# Patient Record
Sex: Female | Born: 1965 | Race: Black or African American | Hispanic: No | Marital: Married | Smoking: Never smoker
Health system: Southern US, Community
[De-identification: ages and names within clinical notes are randomized; demographics above are authoritative.]

## PROBLEM LIST (undated history)

## (undated) DIAGNOSIS — E039 Hypothyroidism, unspecified: Secondary | ICD-10-CM

## (undated) DIAGNOSIS — C801 Malignant (primary) neoplasm, unspecified: Secondary | ICD-10-CM

## (undated) DIAGNOSIS — F319 Bipolar disorder, unspecified: Secondary | ICD-10-CM

## (undated) DIAGNOSIS — R011 Cardiac murmur, unspecified: Secondary | ICD-10-CM

## (undated) DIAGNOSIS — D649 Anemia, unspecified: Secondary | ICD-10-CM

## (undated) HISTORY — PX: OTHER SURGICAL HISTORY: SHX169

## (undated) HISTORY — PX: LAPAROSCOPIC GASTRIC SLEEVE RESECTION: SHX5895

---

## 2000-05-13 HISTORY — PX: UVULOPALATOPHARYNGOPLASTY: SHX827

## 2000-05-13 HISTORY — PX: TUBAL LIGATION: SHX77

## 2000-05-13 HISTORY — PX: TONSILLECTOMY: SUR1361

## 2002-05-13 HISTORY — PX: EYE SURGERY: SHX253

## 2009-05-04 ENCOUNTER — Emergency Department: Payer: Self-pay | Admitting: Emergency Medicine

## 2010-09-07 ENCOUNTER — Emergency Department: Payer: Self-pay | Admitting: Emergency Medicine

## 2010-12-22 IMAGING — CT CT CHEST W/ CM
1 series · 16 of 33 positions shown, 20 images · non-contrast
Comparison: none

REASON FOR EXAM: left chest pain, dyspnea elevated D dimer
COMMENTS:

PROCEDURE:     CT  - CT CHEST (FOR PE) W  - May 05, 2009  [DATE]
RESULT:
TECHNIQUE: Following administration of 100 ml of 5sovue-40D, chest CT is
obtained.

[Series 8: soft tissue · axial · 0.62mm/px · z∈[+1588,+1836]mm · 16 of 91 slices shown, 20 images]
[im 4/91  mediastinal]
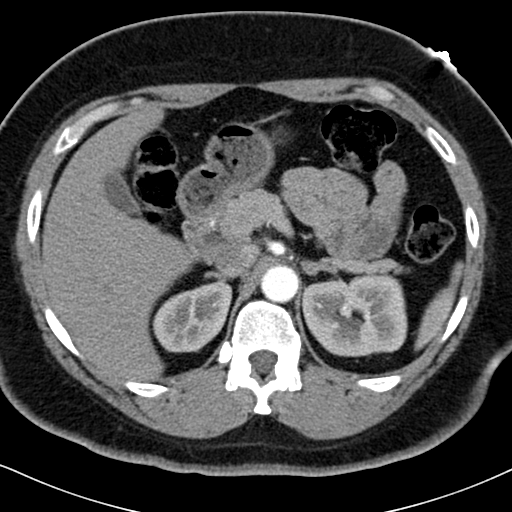
[im 4/91  lung]
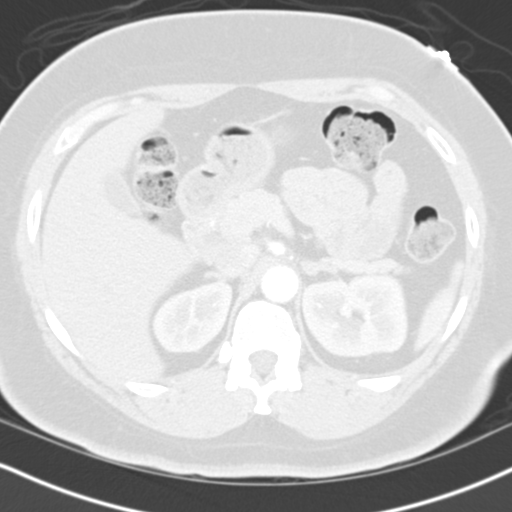
[im 11/91  lung]
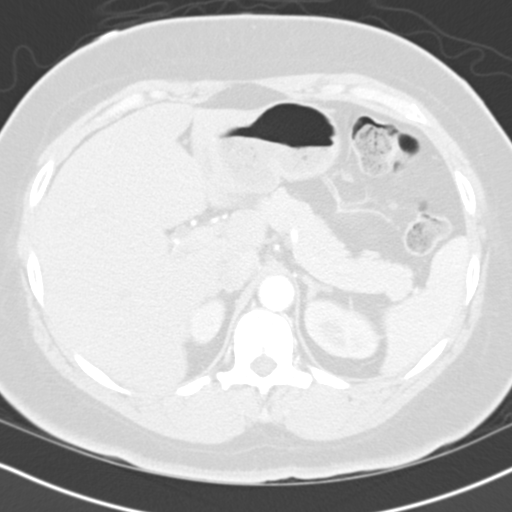
[im 17/91  lung]
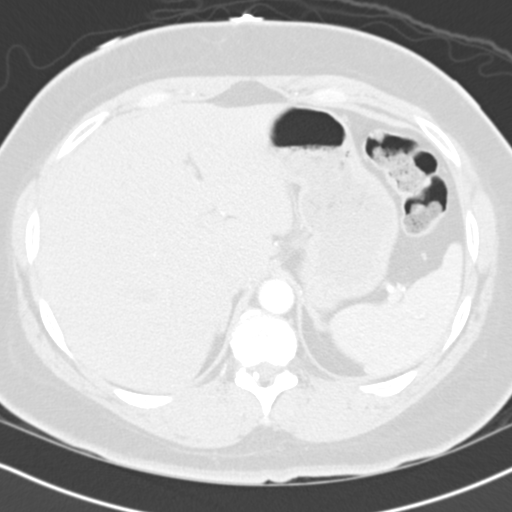
[im 21/91  lung]
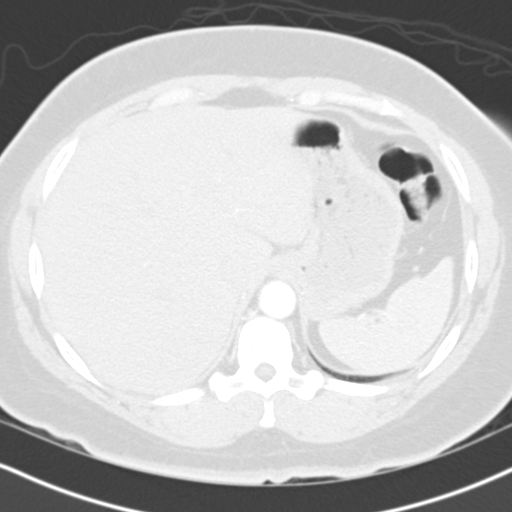
[im 27/91  mediastinal]
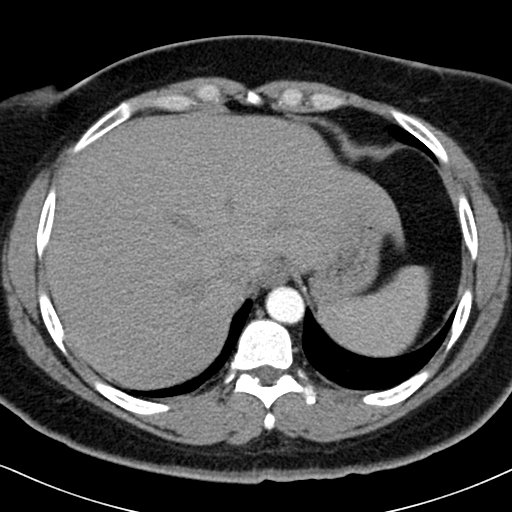
[im 27/91  lung]
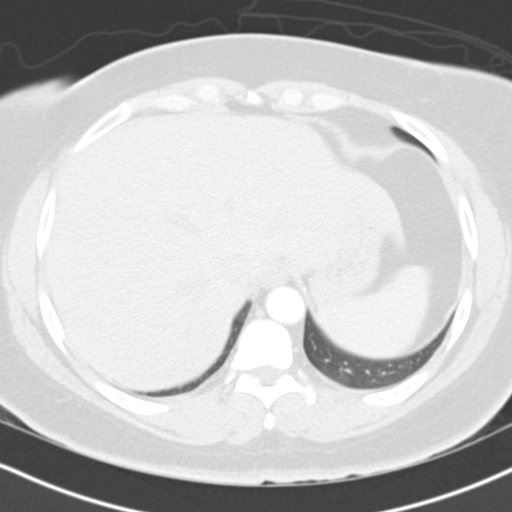
[im 34/91  lung]
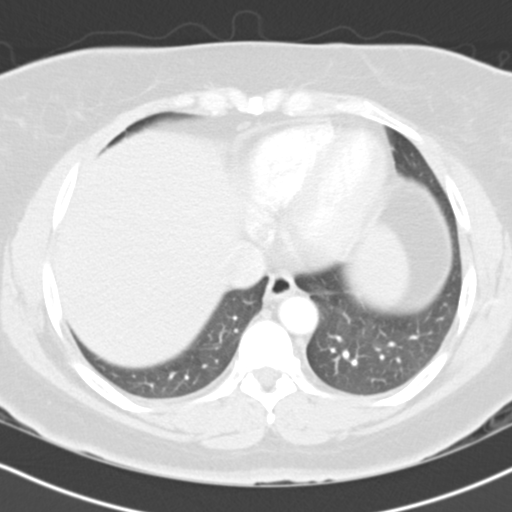
[im 41/91  lung]
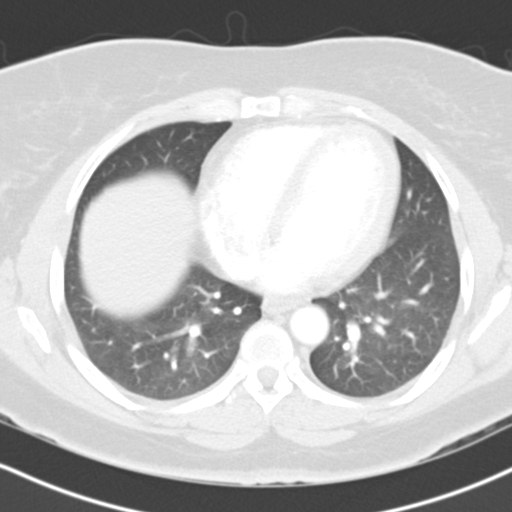
[im 44/91  lung]
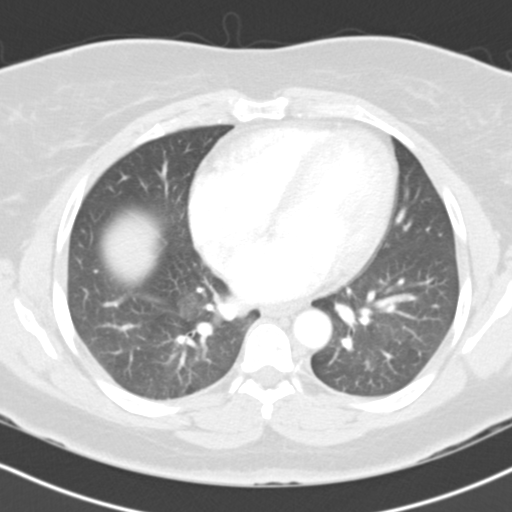
[im 49/91  mediastinal]
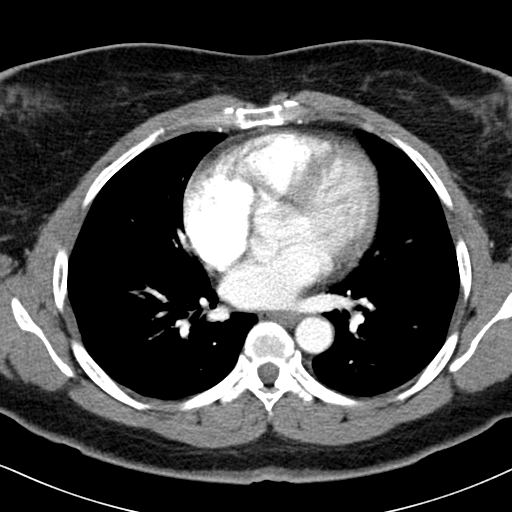
[im 49/91  lung]
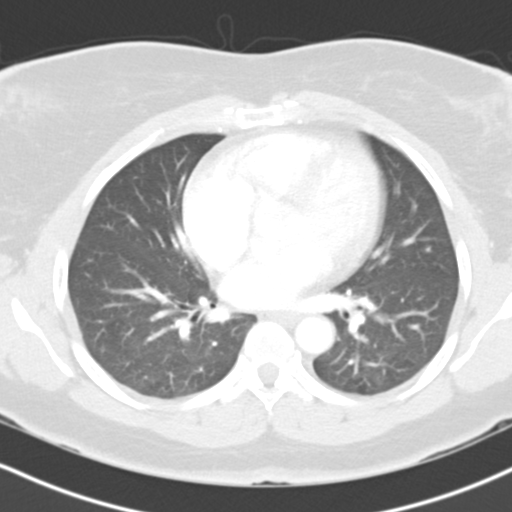
[im 54/91  lung]
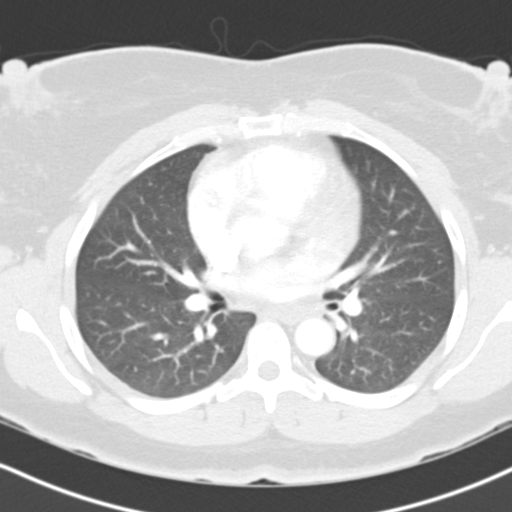
[im 57/91  lung]
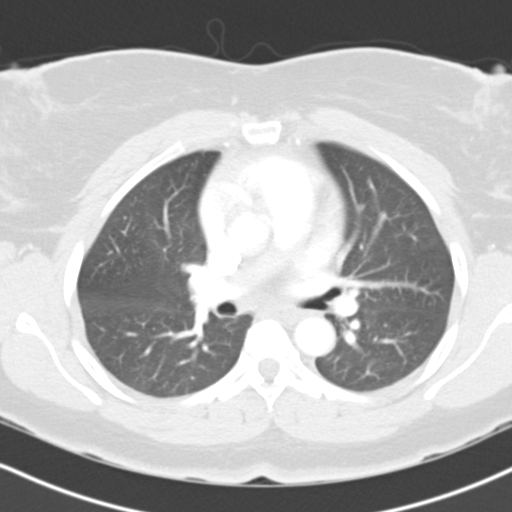
[im 64/91  lung]
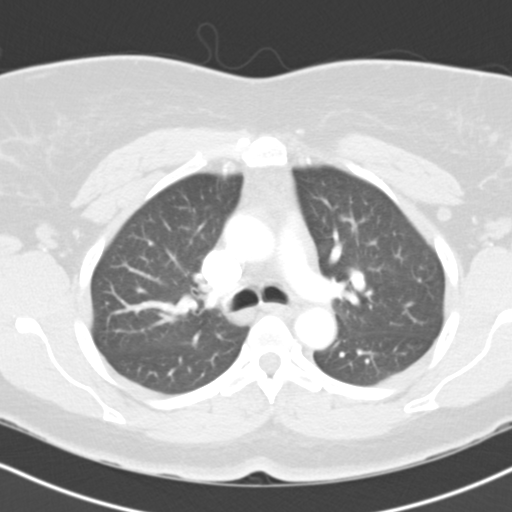
[im 71/91  mediastinal]
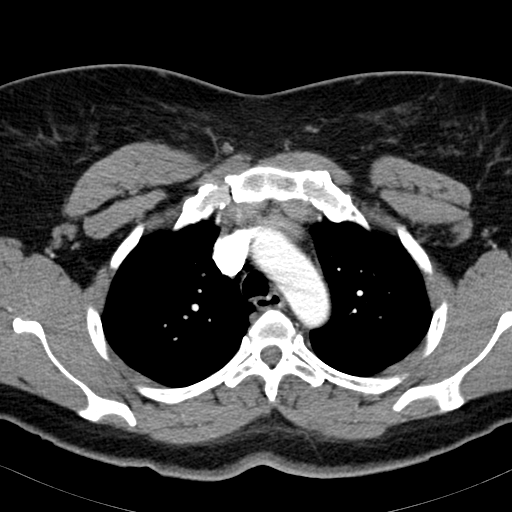
[im 71/91  lung]
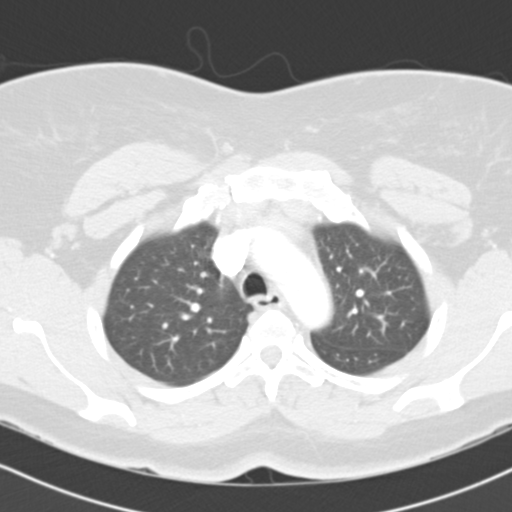
[im 74/91  lung]
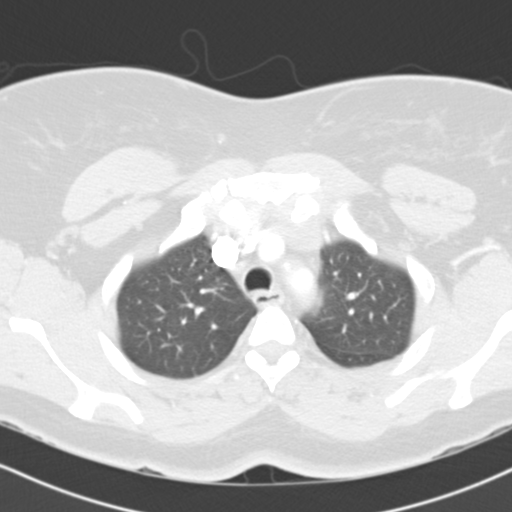
[im 81/91  lung]
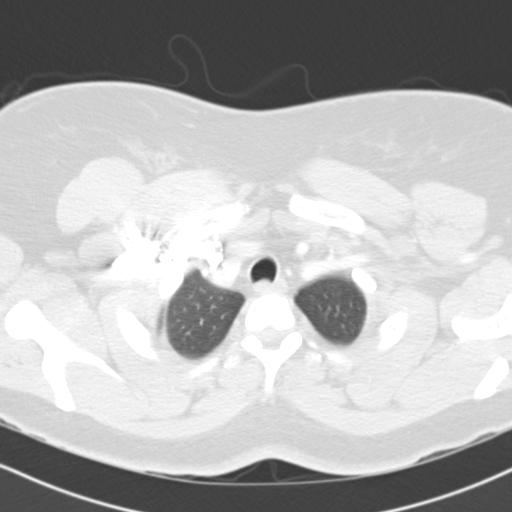
[im 87/91  lung]
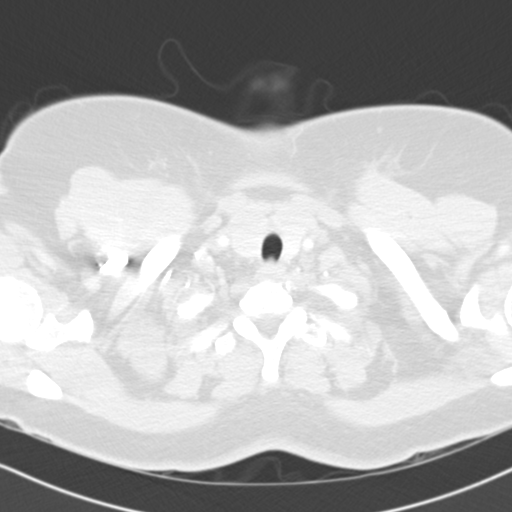

[16 of 33 positions shown; findings below may reference images not displayed]

FINDINGS: The thoracic aorta is normal. The pulmonary arteries are normal.
Axillary lymph nodes are noted. The large airways are patent. No focal
pulmonary infiltrates are noted. There is no free air.
IMPRESSION: No evidence of pulmonary embolus. The thoracic aorta is
unremarkable. No acute abnormality identified.

## 2012-10-16 ENCOUNTER — Ambulatory Visit: Payer: Self-pay | Admitting: Bariatrics

## 2012-10-16 LAB — COMPREHENSIVE METABOLIC PANEL
Alkaline Phosphatase: 77 U/L (ref 50–136)
BUN: 17 mg/dL (ref 7–18)
Bilirubin,Total: 0.2 mg/dL (ref 0.2–1.0)
Creatinine: 1.1 mg/dL (ref 0.60–1.30)
EGFR (African American): >60
Glucose: 126 mg/dL — ABNORMAL HIGH (ref 65–99)
Osmolality: 279 (ref 275–301)
Potassium: 3.1 mmol/L — ABNORMAL LOW (ref 3.5–5.1)
SGOT(AST): 11 U/L — ABNORMAL LOW (ref 15–37)

## 2012-10-16 LAB — PHOSPHORUS: Phosphorus: 4.1 mg/dL (ref 2.5–4.9)

## 2012-10-16 LAB — CBC WITH DIFFERENTIAL/PLATELET
Eosinophil %: 2.8 %
HCT: 33.7 % — ABNORMAL LOW (ref 35.0–47.0)
Lymphocyte #: 3.2 10*3/uL (ref 1.0–3.6)
Lymphocyte %: 29.7 %
MCHC: 33 g/dL (ref 32.0–36.0)
Neutrophil %: 60.6 %
Platelet: 397 10*3/uL (ref 150–440)
RBC: 4.13 10*6/uL (ref 3.80–5.20)
RDW: 15.1 % — ABNORMAL HIGH (ref 11.5–14.5)
WBC: 10.8 10*3/uL (ref 3.6–11.0)

## 2012-10-16 LAB — LIPASE, BLOOD: Lipase: 129 U/L (ref 73–393)

## 2012-10-16 LAB — MAGNESIUM: Magnesium: 1.7 mg/dL — ABNORMAL LOW

## 2012-10-16 LAB — IRON AND TIBC
Iron Bind.Cap.(Total): 292 ug/dL (ref 250–450)
Iron Saturation: 12 %
Unbound Iron-Bind.Cap.: 256 ug/dL

## 2012-10-16 LAB — PROTIME-INR
INR: 1
Prothrombin Time: 13 secs (ref 11.5–14.7)

## 2012-10-16 LAB — TSH: Thyroid Stimulating Horm: 36.2 u[IU]/mL — ABNORMAL HIGH

## 2012-10-16 LAB — HEPATIC FUNCTION PANEL A (ARMC): Bilirubin, Direct: <0.1 mg/dL (ref 0.00–0.20)

## 2012-10-16 LAB — FOLATE: Folic Acid: 26.6 ng/mL (ref 3.1–100.0)

## 2012-10-16 LAB — CALCIUM: Calcium, Total: 8.5 mg/dL (ref 8.5–10.1)

## 2012-10-16 LAB — HEMOGLOBIN A1C: Hemoglobin A1C: 6.4 % — ABNORMAL HIGH (ref 4.2–6.3)

## 2012-11-02 ENCOUNTER — Ambulatory Visit: Payer: Self-pay

## 2012-11-09 ENCOUNTER — Other Ambulatory Visit: Payer: Self-pay | Admitting: Bariatrics

## 2012-11-09 DIAGNOSIS — K219 Gastro-esophageal reflux disease without esophagitis: Secondary | ICD-10-CM

## 2012-11-10 ENCOUNTER — Ambulatory Visit: Payer: Self-pay | Admitting: Bariatrics

## 2012-11-12 ENCOUNTER — Ambulatory Visit
Admission: RE | Admit: 2012-11-12 | Discharge: 2012-11-12 | Disposition: A | Discharge status: Home / Self Care | Payer: BC Managed Care – PPO | Source: Ambulatory Visit | Attending: Bariatrics | Admitting: Bariatrics

## 2012-11-12 DIAGNOSIS — K219 Gastro-esophageal reflux disease without esophagitis: Secondary | ICD-10-CM

## 2012-11-29 ENCOUNTER — Emergency Department: Payer: Self-pay | Admitting: Emergency Medicine

## 2012-12-09 ENCOUNTER — Emergency Department: Payer: Self-pay | Admitting: Emergency Medicine

## 2012-12-09 LAB — CBC
HCT: 31.9 % — ABNORMAL LOW (ref 35.0–47.0)
HGB: 10.4 g/dL — ABNORMAL LOW (ref 12.0–16.0)
Platelet: 362 10*3/uL (ref 150–440)
WBC: 12 10*3/uL — ABNORMAL HIGH (ref 3.6–11.0)

## 2012-12-09 LAB — BASIC METABOLIC PANEL
Anion Gap: 8 (ref 7–16)
Chloride: 101 mmol/L (ref 98–107)
EGFR (African American): >60
Osmolality: 275 (ref 275–301)
Potassium: 2.8 mmol/L — ABNORMAL LOW (ref 3.5–5.1)
Sodium: 137 mmol/L (ref 136–145)

## 2012-12-09 LAB — TROPONIN I: Troponin-I: <0.02 ng/mL

## 2013-02-23 ENCOUNTER — Ambulatory Visit: Payer: Self-pay | Admitting: Bariatrics

## 2013-03-02 ENCOUNTER — Inpatient Hospital Stay: Payer: Self-pay | Admitting: Bariatrics

## 2013-03-02 LAB — CREATININE, SERUM
Creatinine: 1 mg/dL (ref 0.60–1.30)
EGFR (African American): >60

## 2013-03-02 LAB — POTASSIUM: Potassium: 3.7 mmol/L (ref 3.5–5.1)

## 2013-03-03 LAB — BASIC METABOLIC PANEL
Anion Gap: 8 (ref 7–16)
BUN: 8 mg/dL (ref 7–18)
Calcium, Total: 8.5 mg/dL (ref 8.5–10.1)
Chloride: 100 mmol/L (ref 98–107)
Creatinine: 0.9 mg/dL (ref 0.60–1.30)
EGFR (African American): >60
EGFR (Non-African Amer.): >60
Glucose: 97 mg/dL (ref 65–99)
Osmolality: 265 (ref 275–301)
Potassium: 4.1 mmol/L (ref 3.5–5.1)
Sodium: 133 mmol/L — ABNORMAL LOW (ref 136–145)

## 2013-03-03 LAB — CBC WITH DIFFERENTIAL/PLATELET
Basophil #: 0.1 10*3/uL (ref 0.0–0.1)
Basophil %: 0.4 %
HCT: 28.4 % — ABNORMAL LOW (ref 35.0–47.0)
Lymphocyte #: 1.9 10*3/uL (ref 1.0–3.6)
MCV: 78 fL — ABNORMAL LOW (ref 80–100)
Monocyte #: 1 x10 3/mm — ABNORMAL HIGH (ref 0.2–0.9)
Monocyte %: 6.2 %
Platelet: 479 10*3/uL — ABNORMAL HIGH (ref 150–440)
RDW: 15.4 % — ABNORMAL HIGH (ref 11.5–14.5)
WBC: 15.3 10*3/uL — ABNORMAL HIGH (ref 3.6–11.0)

## 2013-03-03 LAB — MAGNESIUM: Magnesium: 1.7 mg/dL — ABNORMAL LOW

## 2013-03-03 LAB — ALBUMIN: Albumin: 2.9 g/dL — ABNORMAL LOW (ref 3.4–5.0)

## 2013-03-03 LAB — PHOSPHORUS: Phosphorus: 3.2 mg/dL (ref 2.5–4.9)

## 2013-03-04 LAB — PATHOLOGY REPORT

## 2013-03-22 ENCOUNTER — Ambulatory Visit: Payer: Self-pay | Admitting: General Practice

## 2013-04-12 ENCOUNTER — Ambulatory Visit: Payer: Self-pay | Admitting: General Practice

## 2014-02-02 ENCOUNTER — Encounter (HOSPITAL_BASED_OUTPATIENT_CLINIC_OR_DEPARTMENT_OTHER): Payer: Self-pay | Admitting: *Deleted

## 2014-02-02 NOTE — Progress Notes (Addendum)
Bring all medications.Spoke with Dr. Al Corpus about Pt;s murmur and Dr.'s note - Ok for surgery,

## 2014-02-07 ENCOUNTER — Encounter (HOSPITAL_BASED_OUTPATIENT_CLINIC_OR_DEPARTMENT_OTHER): Payer: BC Managed Care – PPO | Admitting: Anesthesiology

## 2014-02-07 ENCOUNTER — Ambulatory Visit (HOSPITAL_BASED_OUTPATIENT_CLINIC_OR_DEPARTMENT_OTHER)
Admission: RE | Admit: 2014-02-07 | Discharge: 2014-02-07 | Disposition: A | Discharge status: Home / Self Care | Payer: BC Managed Care – PPO | Source: Ambulatory Visit | Attending: Specialist | Admitting: Specialist

## 2014-02-07 ENCOUNTER — Ambulatory Visit (HOSPITAL_BASED_OUTPATIENT_CLINIC_OR_DEPARTMENT_OTHER): Payer: BC Managed Care – PPO | Admitting: Anesthesiology

## 2014-02-07 ENCOUNTER — Encounter (HOSPITAL_BASED_OUTPATIENT_CLINIC_OR_DEPARTMENT_OTHER)
Admission: RE | Disposition: A | Discharge status: Home / Self Care | Payer: Self-pay | Source: Ambulatory Visit | Attending: Specialist

## 2014-02-07 ENCOUNTER — Encounter (HOSPITAL_BASED_OUTPATIENT_CLINIC_OR_DEPARTMENT_OTHER): Payer: Self-pay

## 2014-02-07 DIAGNOSIS — R011 Cardiac murmur, unspecified: Secondary | ICD-10-CM | POA: Insufficient documentation

## 2014-02-07 DIAGNOSIS — E039 Hypothyroidism, unspecified: Secondary | ICD-10-CM | POA: Insufficient documentation

## 2014-02-07 DIAGNOSIS — F319 Bipolar disorder, unspecified: Secondary | ICD-10-CM | POA: Insufficient documentation

## 2014-02-07 DIAGNOSIS — L732 Hidradenitis suppurativa: Secondary | ICD-10-CM | POA: Diagnosis present

## 2014-02-07 DIAGNOSIS — Z85850 Personal history of malignant neoplasm of thyroid: Secondary | ICD-10-CM | POA: Diagnosis not present

## 2014-02-07 HISTORY — DX: Hypothyroidism, unspecified: E03.9

## 2014-02-07 HISTORY — DX: Bipolar disorder, unspecified: F31.9

## 2014-02-07 HISTORY — PX: HYDRADENITIS EXCISION: SHX5243

## 2014-02-07 HISTORY — DX: Anemia, unspecified: D64.9

## 2014-02-07 HISTORY — DX: Cardiac murmur, unspecified: R01.1

## 2014-02-07 HISTORY — DX: Malignant (primary) neoplasm, unspecified: C80.1

## 2014-02-07 LAB — POCT HEMOGLOBIN-HEMACUE: HEMOGLOBIN: 10 g/dL — AB (ref 12.0–15.0)

## 2014-02-07 SURGERY — EXCISION, HIDRADENITIS, AXILLA
Anesthesia: General | Site: Axilla | Laterality: Left

## 2014-02-07 MED ORDER — CEFAZOLIN SODIUM-DEXTROSE 2-3 GM-% IV SOLR
2.0000 g | INTRAVENOUS | Status: AC
  Administered 2014-02-07: 2 g via INTRAVENOUS

## 2014-02-07 MED ORDER — OXYCODONE HCL 5 MG PO TABS
ORAL_TABLET | ORAL | Status: AC
  Filled 2014-02-07: qty 1

## 2014-02-07 MED ORDER — HYDROMORPHONE HCL 1 MG/ML IJ SOLN
0.2500 mg | INTRAMUSCULAR | Status: DC | PRN
  Administered 2014-02-07 (×3): 0.5 mg via INTRAVENOUS

## 2014-02-07 MED ORDER — LIDOCAINE HCL (CARDIAC) 20 MG/ML IV SOLN
INTRAVENOUS | Status: DC | PRN
  Administered 2014-02-07: 100 mg via INTRAVENOUS

## 2014-02-07 MED ORDER — SCOPOLAMINE 1 MG/3DAYS TD PT72
MEDICATED_PATCH | TRANSDERMAL | Status: DC | PRN
  Administered 2014-02-07: 1 via TRANSDERMAL

## 2014-02-07 MED ORDER — FENTANYL CITRATE 0.05 MG/ML IJ SOLN
INTRAMUSCULAR | Status: AC
Start: 2014-02-07 — End: 2014-02-07
  Filled 2014-02-07: qty 6

## 2014-02-07 MED ORDER — MIDAZOLAM HCL 2 MG/2ML IJ SOLN: 1.0000 mg | INTRAMUSCULAR | Status: DC | PRN

## 2014-02-07 MED ORDER — SCOPOLAMINE 1 MG/3DAYS TD PT72
MEDICATED_PATCH | TRANSDERMAL | Status: AC
  Filled 2014-02-07: qty 1

## 2014-02-07 MED ORDER — OXYCODONE HCL 5 MG PO TABS
5.0000 mg | ORAL_TABLET | Freq: Once | ORAL | Status: AC | PRN
  Administered 2014-02-07: 5 mg via ORAL

## 2014-02-07 MED ORDER — HYDROMORPHONE HCL 1 MG/ML IJ SOLN
INTRAMUSCULAR | Status: AC
  Filled 2014-02-07: qty 1

## 2014-02-07 MED ORDER — PROMETHAZINE HCL 25 MG/ML IJ SOLN: 6.2500 mg | INTRAMUSCULAR | Status: DC | PRN

## 2014-02-07 MED ORDER — SCOPOLAMINE 1 MG/3DAYS TD PT72: 1.0000 | MEDICATED_PATCH | TRANSDERMAL | Status: DC

## 2014-02-07 MED ORDER — MIDAZOLAM HCL 5 MG/5ML IJ SOLN
INTRAMUSCULAR | Status: DC | PRN
  Administered 2014-02-07: 2 mg via INTRAVENOUS

## 2014-02-07 MED ORDER — DIPHENHYDRAMINE HCL 50 MG/ML IJ SOLN
INTRAMUSCULAR | Status: DC | PRN
  Administered 2014-02-07: 12.5 mg via INTRAVENOUS

## 2014-02-07 MED ORDER — FENTANYL CITRATE 0.05 MG/ML IJ SOLN
INTRAMUSCULAR | Status: DC | PRN
Start: 2014-02-07 — End: 2014-02-07
  Administered 2014-02-07: 25 ug via INTRAVENOUS
  Administered 2014-02-07: 100 ug via INTRAVENOUS
  Administered 2014-02-07: 75 ug via INTRAVENOUS

## 2014-02-07 MED ORDER — DEXAMETHASONE SODIUM PHOSPHATE 4 MG/ML IJ SOLN
INTRAMUSCULAR | Status: DC | PRN
  Administered 2014-02-07: 10 mg via INTRAVENOUS

## 2014-02-07 MED ORDER — LIDOCAINE-EPINEPHRINE 0.5 %-1:200000 IJ SOLN
INTRAMUSCULAR | Status: AC
  Filled 2014-02-07: qty 2

## 2014-02-07 MED ORDER — LACTATED RINGERS IV SOLN
INTRAVENOUS | Status: DC
  Administered 2014-02-07: 09:00:00 via INTRAVENOUS

## 2014-02-07 MED ORDER — PROPOFOL 10 MG/ML IV BOLUS
INTRAVENOUS | Status: DC | PRN
  Administered 2014-02-07: 200 mg via INTRAVENOUS

## 2014-02-07 MED ORDER — FENTANYL CITRATE 0.05 MG/ML IJ SOLN: 50.0000 ug | INTRAMUSCULAR | Status: DC | PRN

## 2014-02-07 MED ORDER — LIDOCAINE-EPINEPHRINE 0.5 %-1:200000 IJ SOLN
INTRAMUSCULAR | Status: DC | PRN
  Administered 2014-02-07: 09:00:00 via INTRAMUSCULAR

## 2014-02-07 MED ORDER — MIDAZOLAM HCL 2 MG/2ML IJ SOLN
INTRAMUSCULAR | Status: AC
Start: 2014-02-07 — End: 2014-02-07
  Filled 2014-02-07: qty 2

## 2014-02-07 MED ORDER — CEFAZOLIN SODIUM-DEXTROSE 2-3 GM-% IV SOLR
INTRAVENOUS | Status: AC
Start: 2014-02-07 — End: 2014-02-07
  Filled 2014-02-07: qty 50

## 2014-02-07 MED ORDER — ONDANSETRON HCL 4 MG/2ML IJ SOLN
INTRAMUSCULAR | Status: DC | PRN
  Administered 2014-02-07: 4 mg via INTRAVENOUS

## 2014-02-07 MED ORDER — OXYCODONE HCL 5 MG/5ML PO SOLN: 5.0000 mg | Freq: Once | ORAL | Status: AC | PRN

## 2014-02-07 SURGICAL SUPPLY — 61 items
BAG DECANTER FOR FLEXI CONT (MISCELLANEOUS) IMPLANT
BENZOIN TINCTURE PRP APPL 2/3 (GAUZE/BANDAGES/DRESSINGS) IMPLANT
BLADE KNIFE PERSONA 10 (BLADE) ×3 IMPLANT
BLADE KNIFE PERSONA 15 (BLADE) ×3 IMPLANT
BNDG COHESIVE 4X5 TAN STRL (GAUZE/BANDAGES/DRESSINGS) IMPLANT
BRIEF STRETCH FOR OB PAD LRG (UNDERPADS AND DIAPERS) IMPLANT
CANISTER SUCT 1200ML W/VALVE (MISCELLANEOUS) ×3 IMPLANT
COVER MAYO STAND STRL (DRAPES) ×3 IMPLANT
COVER TABLE BACK 60X90 (DRAPES) ×3 IMPLANT
DECANTER SPIKE VIAL GLASS SM (MISCELLANEOUS) IMPLANT
DRAIN CHANNEL 10M FLAT 3/4 FLT (DRAIN) IMPLANT
DRAIN CHANNEL 7F FF FLAT (WOUND CARE) IMPLANT
DRAPE U-SHAPE 76X120 STRL (DRAPES) ×3 IMPLANT
DRSG OPSITE 6X11 MED (GAUZE/BANDAGES/DRESSINGS) ×3 IMPLANT
DRSG PAD ABDOMINAL 8X10 ST (GAUZE/BANDAGES/DRESSINGS) ×6 IMPLANT
DRSG TELFA 3X8 NADH (GAUZE/BANDAGES/DRESSINGS) ×3 IMPLANT
ELECT REM PT RETURN 9FT ADLT (ELECTROSURGICAL) ×3
ELECTRODE REM PT RTRN 9FT ADLT (ELECTROSURGICAL) ×1 IMPLANT
EVACUATOR SILICONE 100CC (DRAIN) IMPLANT
FILTER 7/8 IN (FILTER) ×3 IMPLANT
GAUZE SPONGE 4X4 12PLY STRL (GAUZE/BANDAGES/DRESSINGS) ×3 IMPLANT
GAUZE XEROFORM 5X9 LF (GAUZE/BANDAGES/DRESSINGS) ×3 IMPLANT
GLOVE BIO SURGEON STRL SZ 6.5 (GLOVE) ×2 IMPLANT
GLOVE BIO SURGEONS STRL SZ 6.5 (GLOVE) ×1
GLOVE BIOGEL M STRL SZ7.5 (GLOVE) ×3 IMPLANT
GLOVE BIOGEL PI IND STRL 8 (GLOVE) ×1 IMPLANT
GLOVE BIOGEL PI INDICATOR 8 (GLOVE) ×2
GLOVE ECLIPSE 7.0 STRL STRAW (GLOVE) ×3 IMPLANT
GOWN STRL REUS W/ TWL XL LVL3 (GOWN DISPOSABLE) ×2 IMPLANT
GOWN STRL REUS W/TWL XL LVL3 (GOWN DISPOSABLE) ×4
IV NS 500ML (IV SOLUTION) ×2
IV NS 500ML BAXH (IV SOLUTION) ×1 IMPLANT
NDL SAFETY ECLIPSE 18X1.5 (NEEDLE) IMPLANT
NEEDLE HYPO 18GX1.5 SHARP (NEEDLE)
NEEDLE HYPO 25X1 1.5 SAFETY (NEEDLE) IMPLANT
NEEDLE SPNL 18GX3.5 QUINCKE PK (NEEDLE) ×3 IMPLANT
NS IRRIG 1000ML POUR BTL (IV SOLUTION) ×3 IMPLANT
PACK BASIN DAY SURGERY FS (CUSTOM PROCEDURE TRAY) ×3 IMPLANT
PEN SKIN MARKING BROAD TIP (MISCELLANEOUS) ×3 IMPLANT
PIN SAFETY STERILE (MISCELLANEOUS) IMPLANT
SHEET MEDIUM DRAPE 40X70 STRL (DRAPES) IMPLANT
SHEETING SILICONE GEL EPI DERM (MISCELLANEOUS) ×3 IMPLANT
SLEEVE SCD COMPRESS KNEE MED (MISCELLANEOUS) ×3 IMPLANT
SPONGE LAP 18X18 X RAY DECT (DISPOSABLE) ×6 IMPLANT
STAPLER VISISTAT 35W (STAPLE) IMPLANT
STOCKINETTE IMPERVIOUS LG (DRAPES) IMPLANT
STRIP SUTURE WOUND CLOSURE 1/2 (SUTURE) ×6 IMPLANT
SUT ETHILON 3 0 FSL (SUTURE) ×3 IMPLANT
SUT MNCRL AB 3-0 PS2 18 (SUTURE) ×3 IMPLANT
SUT MON AB 2-0 CT1 36 (SUTURE) ×6 IMPLANT
SUT PROLENE 4 0 P 3 18 (SUTURE) IMPLANT
SYR 20CC LL (SYRINGE) IMPLANT
SYR 50ML LL SCALE MARK (SYRINGE) ×6 IMPLANT
SYR CONTROL 10ML LL (SYRINGE) ×3 IMPLANT
TOWEL OR 17X24 6PK STRL BLUE (TOWEL DISPOSABLE) ×9 IMPLANT
TRAY DSU PREP LF (CUSTOM PROCEDURE TRAY) ×3 IMPLANT
TUBE CONNECTING 20'X1/4 (TUBING) ×1
TUBE CONNECTING 20X1/4 (TUBING) ×2 IMPLANT
UNDERPAD 30X30 INCONTINENT (UNDERPADS AND DIAPERS) ×3 IMPLANT
VAC PENCILS W/TUBING CLEAR (MISCELLANEOUS) ×3 IMPLANT
YANKAUER SUCT BULB TIP NO VENT (SUCTIONS) ×3 IMPLANT

## 2014-02-07 NOTE — Discharge Instructions (Signed)
Activity (include date of return to work if known) °As tolerated: NO showers °NO driving °No heavy activities ° °Diet:regular No restrictions: ° °Wound Care: Keep dressing clean & dry ° °Do not change dressings ° °Special Instructions: °Do not raise arms up °Call Doctor if any unusual problems occur such as pain, excessive °Bleeding, unrelieved Nausea/vomiting, Fever &/or chills °When lying down, keep head elevated on 2-3 pillows or back-rest ° °Follow-up appointment: Please call the office. ° °The patient received discharge instruction from:___________________________________________ ° ° ° °Patient signature ________________________________________ / Date___________ ° ° ° °Signature of individual providing instructions/ Date________________            ° ° °Post Anesthesia Home Care Instructions ° °Activity: °Get plenty of rest for the remainder of the day. A responsible adult should stay with you for 24 hours following the procedure.  °For the next 24 hours, DO NOT: °-Drive a car °-Operate machinery °-Drink alcoholic beverages °-Take any medication unless instructed by your physician °-Make any legal decisions or sign important papers. ° °Meals: °Start with liquid foods such as gelatin or soup. Progress to regular foods as tolerated. Avoid greasy, spicy, heavy foods. If nausea and/or vomiting occur, drink only clear liquids until the nausea and/or vomiting subsides. Call your physician if vomiting continues. ° °Special Instructions/Symptoms: °Your throat may feel dry or sore from the anesthesia or the breathing tube placed in your throat during surgery. If this causes discomfort, gargle with warm salt water. The discomfort should disappear within 24 hours. ° °

## 2014-02-07 NOTE — Anesthesia Preprocedure Evaluation (Signed)
Anesthesia Evaluation  Patient identified by MRN, date of birth, ID band Patient awake    Reviewed: Allergy & Precautions, H&P , NPO status , Patient's Chart, lab work & pertinent test results  Airway Mallampati: II TM Distance: >3 FB Neck ROM: Full    Dental  (+) Teeth Intact, Dental Advisory Given   Pulmonary neg pulmonary ROS,    Pulmonary exam normal       Cardiovascular negative cardio ROS      Neuro/Psych PSYCHIATRIC DISORDERS Bipolar Disorder negative neurological ROS     GI/Hepatic negative GI ROS, Neg liver ROS,   Endo/Other  Hypothyroidism   Renal/GU negative Renal ROS  negative genitourinary   Musculoskeletal negative musculoskeletal ROS (+)   Abdominal   Peds negative pediatric ROS (+)  Hematology negative hematology ROS (+)   Anesthesia Other Findings   Reproductive/Obstetrics negative OB ROS                           Anesthesia Physical Anesthesia Plan  ASA: II  Anesthesia Plan: General   Post-op Pain Management:    Induction: Intravenous  Airway Management Planned: LMA  Additional Equipment:   Intra-op Plan:   Post-operative Plan: Extubation in OR  Informed Consent: I have reviewed the patients History and Physical, chart, labs and discussed the procedure including the risks, benefits and alternatives for the proposed anesthesia with the patient or authorized representative who has indicated his/her understanding and acceptance.   Dental advisory given  Plan Discussed with: CRNA, Anesthesiologist and Surgeon  Anesthesia Plan Comments:         Anesthesia Quick Evaluation

## 2014-02-07 NOTE — Anesthesia Procedure Notes (Signed)
Procedure Name: LMA Insertion Performed by: Azani Brogdon W Pre-anesthesia Checklist: Patient identified, Timeout performed, Emergency Drugs available, Suction available and Patient being monitored Patient Re-evaluated:Patient Re-evaluated prior to inductionOxygen Delivery Method: Circle system utilized Preoxygenation: Pre-oxygenation with 100% oxygen Intubation Type: IV induction Ventilation: Mask ventilation without difficulty LMA: LMA inserted LMA Size: 4.0 Tube type: Oral Number of attempts: 1 Placement Confirmation: positive ETCO2 Tube secured with: Tape Dental Injury: Teeth and Oropharynx as per pre-operative assessment      

## 2014-02-07 NOTE — Anesthesia Postprocedure Evaluation (Signed)
  Anesthesia Post-op Note  Patient: Sharon Everett  Procedure(s) Performed: Procedure(s): EXCISION HIDRADENITIS left  AXILLA WITH RYAN Coleman  (Left)  Patient Location: PACU  Anesthesia Type: No value filed.   Level of Consciousness: awake, alert  and oriented  Airway and Oxygen Therapy: Patient Spontanous Breathing  Post-op Pain: mild  Post-op Assessment: Post-op Vital signs reviewed  Post-op Vital Signs: Reviewed  Last Vitals:  Filed Vitals:   02/07/14 1144  BP: 147/82  Pulse: 78  Temp: 36.8 C  Resp: 16    Complications: No apparent anesthesia complications

## 2014-02-07 NOTE — Brief Op Note (Signed)
02/07/2014  10:10 AM  PATIENT:  Sharon Everett  48 y.o. female  PRE-OPERATIVE DIAGNOSIS:  Hidradenitis [705.83]Left Axilla  POST-OPERATIVE DIAGNOSIS:  Hidradenitis [705.83]Left Axilla  PROCEDURE:  Procedure(s): EXCISION HIDRADENITIS left  AXILLA WITH RYAN POLLOCK CLOSURE  (Left)  SURGEON:  Surgeon(s) and Role:    * Cristine Polio, MD - Primary  PHYSICIAN ASSISTANT:   ASSISTANTS: none   ANESTHESIA:   general  EBL:  Total I/O In: 1300 [I.V.:1300] Out: -   BLOOD ADMINISTERED:none  DRAINS: none   LOCAL MEDICATIONS USED:  LIDOCAINE   SPECIMEN:  Excision  DISPOSITION OF SPECIMEN:  PATHOLOGY  COUNTS:  YES  TOURNIQUET:  * No tourniquets in log *  DICTATION: .Other Dictation: Dictation Number [id number]  PLAN OF CARE: Discharge to home after PACU  PATIENT DISPOSITION:  PACU - hemodynamically stable.   Delay start of Pharmacological VTE agent (>24hrs) due to surgical blood loss or risk of bleeding: yes

## 2014-02-07 NOTE — H&P (Signed)
Sharon Everett is an 48 y.o. female.   Chief Complaint: Hidraidnitis right and left axillae HPI: Increased drainage and very painful breakouts  Hx of several breakouts requiring  Incision and drainage  Past Medical History  Diagnosis Date  . Heart murmur   . Hypothyroidism   . Bipolar disorder   . Cancer     thyroid cancer - 2010  . Anemia     Past Surgical History  Procedure Laterality Date  . Thyroidecttomy      2011  . Laparoscopic gastric sleeve resection      2014 at [place]  . Eye surgery  2004    Lasix  . Tubal ligation  2002    History reviewed. No pertinent family history. Social History:  reports that she has never smoked. She does not have any smokeless tobacco history on file. She reports that she drinks alcohol. She reports that she does not use illicit drugs.  Allergies: No Known Allergies  No prescriptions prior to admission    No results found for this or any previous visit (from the past 48 hour(s)). No results found.  Review of Systems  Constitutional: Negative.   HENT: Negative.   Eyes: Negative.   Respiratory: Negative.   Cardiovascular: Negative.   Gastrointestinal: Negative.   Genitourinary: Negative.   Musculoskeletal: Negative.   Skin: Positive for rash.  Neurological: Negative.   Endo/Heme/Allergies: Negative.   Psychiatric/Behavioral: Negative.     Height 5\' 9"  (1.753 m), weight 87.091 kg (192 lb), last menstrual period 01/17/2014. Physical Exam   Assessment/Plan Hidraidnitis suppurativa axilla  For excision and plastic rotational flap coverage  [name] closure  Sharon Everett 02/07/2014, 8:11 AM

## 2014-02-07 NOTE — Transfer of Care (Signed)
Immediate Anesthesia Transfer of Care Note  Patient: Sharon Everett  Procedure(s) Performed: Procedure(s): EXCISION HIDRADENITIS left  AXILLA WITH RYAN Ringling  (Left)  Patient Location: PACU  Anesthesia Type:General  Level of Consciousness: awake and oriented  Airway & Oxygen Therapy: Patient Spontanous Breathing and Patient connected to face mask oxygen  Post-op Assessment: Report given to PACU RN and Post -op Vital signs reviewed and stable  Post vital signs: Reviewed and stable  Complications: No apparent anesthesia complications

## 2014-02-08 ENCOUNTER — Encounter (HOSPITAL_BASED_OUTPATIENT_CLINIC_OR_DEPARTMENT_OTHER): Payer: Self-pay | Admitting: Specialist

## 2014-02-08 NOTE — Op Note (Signed)
Sharon Everett, Sharon Everett               ACCOUNT NO.:  [id number]  MEDICAL RECORD NO.:  [id number]  LOCATION:                                 FACILITY:  PHYSICIAN:  Berneta Sages L. 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  These are very severe in nature. She has had multiple I and D's done with no avail since that is not the treatment for this condition.  PROCEDURES PLANNED:  Wide excision of large deficits of the left axilla which measured over 8.5 inches x 7 inches with rotational flap coverage, Ryan-Pollack closure.  All procedure in detail as well as tentative risks and possible complications were explained to the patient preoperatively, the patient understands and consents to surgery.  She understands also the risk of scarring, infection, possible dehiscence, etc.  DESCRIPTION OF PROCEDURE:  The patient underwent drawings of the areas of the left axilla with a marking pen, outlined a large, large flap, elliptical in nature and slightly diamond shaped.  She then underwent general anesthesia, intubated orally.  Prep was done to the left arm, upper extremity, breast, chest, neck areas with Hibiclens soap and solution and walled off with sterile towels and drapes so as to make a sterile field.  A 0.5% Xylocaine with epinephrine was injected locally, a total of 200 mL 1:400,000 concentration.  After waiting appropriate amount of time, we placed OpSite over the area to prevent cross contamination during the repair.  Wide excision was made of the previously drawn elliptical area with a Bovie unit on cutting down to underlying superficial fascia.  I was able to then dissect out all of the diseased tissue  deep down to some of the deeper fascia and certain areas in the middle.  We were able to remove the mass in toto and sent it to pathology for examination.  Next, proper hemostasis was maintained with the Bovie on coagulation.  The medial and lateral flaps then were freed out approximately 8 cm.  After proper hemostasis, the flaps were then rotated and advanced together and stayed with deep sutures of 2-0 Monocryl x2 layers, subcutaneous stitch of 2-0 Monocryl, subdermal suture of 3-0 Monocryl, and then a running subcuticular stitch of 3-0 Monocryl.  Next, stay sutures of 3-0 nylon were placed intermittently for security.  The wounds were cleansed.  Steri-Strips and sterile silicone gel patches were placed.  Xeroform, 4x4s, ABDs, and Hypafix tape.  She tolerated the procedures very, very well and was taken to recovery in excellent condition.  ESTIMATED BLOOD LOSS:  Less than 50 mL.  COMPLICATIONS:  None.     Odella Aquas. Towanda Malkin, M.D.    Elie Confer  D:  02/07/2014  T:  02/08/2014  Job:  [id number]

## 2014-04-14 ENCOUNTER — Encounter (HOSPITAL_BASED_OUTPATIENT_CLINIC_OR_DEPARTMENT_OTHER): Payer: Self-pay | Admitting: *Deleted

## 2014-04-14 NOTE — Progress Notes (Signed)
Pt was here 9/15-did well-no labs neded

## 2014-04-18 ENCOUNTER — Encounter (HOSPITAL_BASED_OUTPATIENT_CLINIC_OR_DEPARTMENT_OTHER)
Admission: RE | Disposition: A | Discharge status: Home / Self Care | Payer: Self-pay | Source: Ambulatory Visit | Attending: Specialist

## 2014-04-18 ENCOUNTER — Encounter (HOSPITAL_BASED_OUTPATIENT_CLINIC_OR_DEPARTMENT_OTHER): Payer: Self-pay | Admitting: *Deleted

## 2014-04-18 ENCOUNTER — Ambulatory Visit (HOSPITAL_BASED_OUTPATIENT_CLINIC_OR_DEPARTMENT_OTHER): Payer: BC Managed Care – PPO | Admitting: Anesthesiology

## 2014-04-18 ENCOUNTER — Ambulatory Visit (HOSPITAL_BASED_OUTPATIENT_CLINIC_OR_DEPARTMENT_OTHER)
Admission: RE | Admit: 2014-04-18 | Discharge: 2014-04-18 | Disposition: A | Discharge status: Home / Self Care | Payer: BC Managed Care – PPO | Source: Ambulatory Visit | Attending: Specialist | Admitting: Specialist

## 2014-04-18 DIAGNOSIS — E039 Hypothyroidism, unspecified: Secondary | ICD-10-CM | POA: Diagnosis not present

## 2014-04-18 DIAGNOSIS — L732 Hidradenitis suppurativa: Secondary | ICD-10-CM | POA: Diagnosis not present

## 2014-04-18 DIAGNOSIS — Z85850 Personal history of malignant neoplasm of thyroid: Secondary | ICD-10-CM | POA: Insufficient documentation

## 2014-04-18 HISTORY — PX: HYDRADENITIS EXCISION: SHX5243

## 2014-04-18 LAB — POCT HEMOGLOBIN-HEMACUE: Hemoglobin: 9.2 g/dL — ABNORMAL LOW (ref 12.0–15.0)

## 2014-04-18 SURGERY — EXCISION, HIDRADENITIS, AXILLA
Anesthesia: General | Site: Axilla | Laterality: Right

## 2014-04-18 MED ORDER — FENTANYL CITRATE 0.05 MG/ML IJ SOLN: 25.0000 ug | Freq: Once | INTRAMUSCULAR | Status: DC

## 2014-04-18 MED ORDER — DEXAMETHASONE SODIUM PHOSPHATE 4 MG/ML IJ SOLN
INTRAMUSCULAR | Status: DC | PRN
  Administered 2014-04-18: 10 mg via INTRAVENOUS

## 2014-04-18 MED ORDER — FENTANYL CITRATE 0.05 MG/ML IJ SOLN
25.0000 ug | INTRAMUSCULAR | Status: DC | PRN
  Administered 2014-04-18: 25 ug via INTRAVENOUS
  Administered 2014-04-18 (×3): 50 ug via INTRAVENOUS

## 2014-04-18 MED ORDER — FENTANYL CITRATE 0.05 MG/ML IJ SOLN
INTRAMUSCULAR | Status: AC
Start: 2014-04-18 — End: 2014-04-18
  Filled 2014-04-18: qty 2

## 2014-04-18 MED ORDER — LIDOCAINE HCL (CARDIAC) 20 MG/ML IV SOLN
INTRAVENOUS | Status: DC | PRN
  Administered 2014-04-18: 50 mg via INTRAVENOUS

## 2014-04-18 MED ORDER — OXYCODONE HCL 5 MG PO TABS
ORAL_TABLET | ORAL | Status: AC
  Filled 2014-04-18: qty 1

## 2014-04-18 MED ORDER — FENTANYL CITRATE 0.05 MG/ML IJ SOLN
INTRAMUSCULAR | Status: AC
  Filled 2014-04-18: qty 2

## 2014-04-18 MED ORDER — CEFAZOLIN SODIUM-DEXTROSE 2-3 GM-% IV SOLR
2.0000 g | INTRAVENOUS | Status: AC
  Administered 2014-04-18: 2 g via INTRAVENOUS

## 2014-04-18 MED ORDER — FENTANYL CITRATE 0.05 MG/ML IJ SOLN
INTRAMUSCULAR | Status: AC
  Filled 2014-04-18: qty 4

## 2014-04-18 MED ORDER — MIDAZOLAM HCL 2 MG/2ML IJ SOLN
INTRAMUSCULAR | Status: AC
  Filled 2014-04-18: qty 2

## 2014-04-18 MED ORDER — MIDAZOLAM HCL 5 MG/5ML IJ SOLN
INTRAMUSCULAR | Status: DC | PRN
  Administered 2014-04-18: 2 mg via INTRAVENOUS

## 2014-04-18 MED ORDER — CEFAZOLIN SODIUM-DEXTROSE 2-3 GM-% IV SOLR
INTRAVENOUS | Status: AC
Start: 2014-04-18 — End: 2014-04-18
  Filled 2014-04-18: qty 50

## 2014-04-18 MED ORDER — MIDAZOLAM HCL 2 MG/2ML IJ SOLN: 1.0000 mg | INTRAMUSCULAR | Status: DC | PRN

## 2014-04-18 MED ORDER — ONDANSETRON HCL 4 MG/2ML IJ SOLN
INTRAMUSCULAR | Status: DC | PRN
Start: 2014-04-18 — End: 2014-04-18
  Administered 2014-04-18: 4 mg via INTRAVENOUS

## 2014-04-18 MED ORDER — OXYCODONE HCL 5 MG PO TABS
5.0000 mg | ORAL_TABLET | Freq: Once | ORAL | Status: AC
  Administered 2014-04-18: 5 mg via ORAL

## 2014-04-18 MED ORDER — OXYCODONE HCL 5 MG PO TABS
5.0000 mg | ORAL_TABLET | ORAL | Status: DC | PRN
  Administered 2014-04-18: 5 mg via ORAL

## 2014-04-18 MED ORDER — LACTATED RINGERS IV SOLN
INTRAVENOUS | Status: DC
  Administered 2014-04-18: 07:00:00 via INTRAVENOUS

## 2014-04-18 MED ORDER — FENTANYL CITRATE 0.05 MG/ML IJ SOLN
INTRAMUSCULAR | Status: DC | PRN
  Administered 2014-04-18: 100 ug via INTRAVENOUS

## 2014-04-18 MED ORDER — SODIUM CHLORIDE 0.9 % IV SOLN
INTRAVENOUS | Status: DC | PRN
  Administered 2014-04-18: 200 mL via INTRAMUSCULAR

## 2014-04-18 MED ORDER — PROPOFOL 10 MG/ML IV BOLUS
INTRAVENOUS | Status: DC | PRN
  Administered 2014-04-18: 200 mg via INTRAVENOUS

## 2014-04-18 MED ORDER — FENTANYL CITRATE 0.05 MG/ML IJ SOLN: 100.0000 ug | Freq: Once | INTRAMUSCULAR | Status: DC

## 2014-04-18 MED ORDER — FENTANYL CITRATE 0.05 MG/ML IJ SOLN: 50.0000 ug | INTRAMUSCULAR | Status: DC | PRN

## 2014-04-18 SURGICAL SUPPLY — 59 items
BAG DECANTER FOR FLEXI CONT (MISCELLANEOUS) ×3 IMPLANT
BENZOIN TINCTURE PRP APPL 2/3 (GAUZE/BANDAGES/DRESSINGS) ×3 IMPLANT
BLADE KNIFE PERSONA 10 (BLADE) ×3 IMPLANT
BLADE KNIFE PERSONA 15 (BLADE) ×3 IMPLANT
BNDG COHESIVE 4X5 TAN STRL (GAUZE/BANDAGES/DRESSINGS) ×6 IMPLANT
BRIEF STRETCH FOR OB PAD LRG (UNDERPADS AND DIAPERS) IMPLANT
CANISTER SUCT 1200ML W/VALVE (MISCELLANEOUS) ×3 IMPLANT
COVER BACK TABLE 60X90IN (DRAPES) ×3 IMPLANT
COVER MAYO STAND STRL (DRAPES) ×3 IMPLANT
DECANTER SPIKE VIAL GLASS SM (MISCELLANEOUS) IMPLANT
DRAIN CHANNEL 10M FLAT 3/4 FLT (DRAIN) IMPLANT
DRAIN CHANNEL 7F FF FLAT (WOUND CARE) IMPLANT
DRAPE U-SHAPE 76X120 STRL (DRAPES) ×6 IMPLANT
DRSG PAD ABDOMINAL 8X10 ST (GAUZE/BANDAGES/DRESSINGS) ×3 IMPLANT
DRSG TELFA 3X8 NADH (GAUZE/BANDAGES/DRESSINGS) ×3 IMPLANT
ELECT REM PT RETURN 9FT ADLT (ELECTROSURGICAL) ×3
ELECTRODE REM PT RTRN 9FT ADLT (ELECTROSURGICAL) ×1 IMPLANT
EVACUATOR SILICONE 100CC (DRAIN) IMPLANT
FILTER 7/8 IN (FILTER) ×3 IMPLANT
GAUZE SPONGE 4X4 12PLY STRL (GAUZE/BANDAGES/DRESSINGS) ×3 IMPLANT
GAUZE XEROFORM 5X9 LF (GAUZE/BANDAGES/DRESSINGS) ×3 IMPLANT
GLOVE BIO SURGEON STRL SZ 6.5 (GLOVE) ×2 IMPLANT
GLOVE BIO SURGEONS STRL SZ 6.5 (GLOVE) ×1
GLOVE BIOGEL M STRL SZ7.5 (GLOVE) ×3 IMPLANT
GLOVE BIOGEL PI IND STRL 8 (GLOVE) ×1 IMPLANT
GLOVE BIOGEL PI INDICATOR 8 (GLOVE) ×2
GLOVE ECLIPSE 7.0 STRL STRAW (GLOVE) ×3 IMPLANT
GOWN STRL REUS W/ TWL XL LVL3 (GOWN DISPOSABLE) ×2 IMPLANT
GOWN STRL REUS W/TWL XL LVL3 (GOWN DISPOSABLE) ×4
IV NS 500ML (IV SOLUTION) ×2
IV NS 500ML BAXH (IV SOLUTION) ×1 IMPLANT
NDL SAFETY ECLIPSE 18X1.5 (NEEDLE) IMPLANT
NEEDLE HYPO 18GX1.5 SHARP (NEEDLE)
NEEDLE HYPO 25X1 1.5 SAFETY (NEEDLE) IMPLANT
NEEDLE SPNL 18GX3.5 QUINCKE PK (NEEDLE) ×3 IMPLANT
NS IRRIG 1000ML POUR BTL (IV SOLUTION) ×3 IMPLANT
PACK BASIN DAY SURGERY FS (CUSTOM PROCEDURE TRAY) ×3 IMPLANT
PEN SKIN MARKING BROAD TIP (MISCELLANEOUS) ×3 IMPLANT
PIN SAFETY STERILE (MISCELLANEOUS) IMPLANT
SHEET MEDIUM DRAPE 40X70 STRL (DRAPES) ×3 IMPLANT
SLEEVE SCD COMPRESS KNEE MED (MISCELLANEOUS) ×3 IMPLANT
SPONGE LAP 18X18 X RAY DECT (DISPOSABLE) ×6 IMPLANT
STAPLER VISISTAT 35W (STAPLE) IMPLANT
STOCKINETTE IMPERVIOUS LG (DRAPES) ×3 IMPLANT
STRIP SUTURE WOUND CLOSURE 1/2 (SUTURE) ×3 IMPLANT
SUT ETHILON 3 0 FSL (SUTURE) ×3 IMPLANT
SUT MNCRL AB 3-0 PS2 18 (SUTURE) ×3 IMPLANT
SUT MON AB 2-0 CT1 36 (SUTURE) ×3 IMPLANT
SUT PROLENE 4 0 P 3 18 (SUTURE) IMPLANT
SYR 20CC LL (SYRINGE) ×6 IMPLANT
SYR 50ML LL SCALE MARK (SYRINGE) IMPLANT
SYR CONTROL 10ML LL (SYRINGE) IMPLANT
TOWEL OR 17X24 6PK STRL BLUE (TOWEL DISPOSABLE) ×9 IMPLANT
TRAY DSU PREP LF (CUSTOM PROCEDURE TRAY) ×3 IMPLANT
TUBE CONNECTING 20'X1/4 (TUBING) ×1
TUBE CONNECTING 20X1/4 (TUBING) ×2 IMPLANT
UNDERPAD 30X30 INCONTINENT (UNDERPADS AND DIAPERS) ×6 IMPLANT
VAC PENCILS W/TUBING CLEAR (MISCELLANEOUS) ×3 IMPLANT
YANKAUER SUCT BULB TIP NO VENT (SUCTIONS) ×3 IMPLANT

## 2014-04-18 NOTE — Transfer of Care (Signed)
Immediate Anesthesia Transfer of Care Note  Patient: Sharon Everett  Procedure(s) Performed: Procedure(s): EXCISION HIDRADENITIS RIGHT AXILLA/RYAN-POLLAC CLOSURE (Right)  Patient Location: PACU  Anesthesia Type:General  Level of Consciousness: awake, alert , oriented and patient cooperative  Airway & Oxygen Therapy: Patient Spontanous Breathing and Patient connected to face mask oxygen  Post-op Assessment: Report given to PACU RN and Post -op Vital signs reviewed and stable  Post vital signs: Reviewed and stable  Complications: No apparent anesthesia complications

## 2014-04-18 NOTE — Anesthesia Procedure Notes (Signed)
Procedure Name: LMA Insertion Performed by: Malikah Principato W Pre-anesthesia Checklist: Patient identified, Timeout performed, Emergency Drugs available, Suction available and Patient being monitored Patient Re-evaluated:Patient Re-evaluated prior to inductionOxygen Delivery Method: Circle system utilized Preoxygenation: Pre-oxygenation with 100% oxygen Intubation Type: IV induction Ventilation: Mask ventilation without difficulty LMA: LMA inserted LMA Size: 4.0 Number of attempts: 1 Placement Confirmation: breath sounds checked- equal and bilateral and positive ETCO2 Tube secured with: Tape Dental Injury: Teeth and Oropharynx as per pre-operative assessment      

## 2014-04-18 NOTE — Anesthesia Preprocedure Evaluation (Signed)
Anesthesia Evaluation Anesthesia Physical Anesthesia Plan  ASA: II  Anesthesia Plan:    Post-op Pain Management:    Induction:   Airway Management Planned:   Additional Equipment:   Intra-op Plan:   Post-operative Plan:   Informed Consent:   Plan Discussed with:   Anesthesia Plan Comments:         Anesthesia Quick Evaluation

## 2014-04-18 NOTE — H&P (Signed)
Sharon Everett is an 48 y.o. female.   Chief Complaint: Severe hidraidnitis of the right axilla HPI: Multiple incision and drainages with antibiotic coverage multiple times with no avail  Past Medical History  Diagnosis Date  . Hypothyroidism   . Bipolar disorder   . Cancer     thyroid cancer - 2010  . Anemia   . Heart murmur     [facility] [place]    Past Surgical History  Procedure Laterality Date  . Thyroidecttomy      2011  . Laparoscopic gastric sleeve resection      2014 at [place]  . Eye surgery  2004    Lasix  . Tubal ligation  2002  . Hydradenitis excision Left 02/07/2014    Procedure: EXCISION HIDRADENITIS left  AXILLA WITH RYAN [place] ;  Surgeon: Cristine Polio, MD;  Location: [facility];  Service: Plastics;  Laterality: Left;  . Uvulopalatopharyngoplasty  2002  . Tonsillectomy  2002    History reviewed. No pertinent family history. Social History:  reports that she has never smoked. She does not have any smokeless tobacco history on file. She reports that she drinks alcohol. She reports that she does not use illicit drugs.  Allergies:  Allergies  Allergen Reactions  . Hydrocodone Itching    Medications Prior to Admission  Medication Sig Dispense Refill  . lamoTRIgine (LAMICTAL) 200 MG tablet Take 200 mg by mouth daily.    Marland Kitchen levothyroxine (SYNTHROID, LEVOTHROID) 200 MCG tablet Take 200 mcg by mouth daily before breakfast.    . ferrous sulfate 325 (65 FE) MG tablet Take 325 mg by mouth 2 (two) times daily with a meal.      No results found for this or any previous visit (from the past 48 hour(s)). No results found.  Review of Systems  Constitutional: Negative.   HENT: Negative.   Eyes: Negative.   Respiratory: Negative.   Cardiovascular: Negative.   Gastrointestinal: Negative.   Genitourinary: Negative.   Musculoskeletal: Negative.   Skin: Positive for rash.  Endo/Heme/Allergies: Negative.    Psychiatric/Behavioral: The patient is nervous/anxious.     Blood pressure 134/81, pulse 72, temperature 98.4 F (36.9 C), temperature source Oral, resp. rate 16, height 5\' 9"  (1.753 m), weight 85.73 kg (189 lb), last menstrual period 03/14/2014, SpO2 100 %. Physical Exam   Assessment/Plan Hidraidnitis right axilla severe for excision and [name] rotational flap coverage  Daymond Cordts L 04/18/2014, 7:32 AM

## 2014-04-18 NOTE — Anesthesia Postprocedure Evaluation (Signed)
  Anesthesia Post-op Note  Patient: Sharon Everett  Procedure(s) Performed: Procedure(s): EXCISION HIDRADENITIS RIGHT AXILLA/RYAN-POLLAC CLOSURE (Right)  Patient Location: PACU  Anesthesia Type:General  Level of Consciousness: awake  Airway and Oxygen Therapy: Patient Spontanous Breathing  Post-op Pain: mild  Post-op Assessment: Post-op Vital signs reviewed  Post-op Vital Signs: Reviewed  Last Vitals:  Filed Vitals:   04/18/14 0900  BP: 145/87  Pulse: 92  Temp:   Resp: 14    Complications: No apparent anesthesia complications

## 2014-04-18 NOTE — Brief Op Note (Signed)
04/18/2014  8:57 AM  PATIENT:  Sharon Everett  48 y.o. female  PRE-OPERATIVE DIAGNOSIS:  hidradenitis  POST-OPERATIVE DIAGNOSIS:  hidradenitis  PROCEDURE:  Procedure(s): EXCISION HIDRADENITIS RIGHT AXILLA/RYAN-POLLAC CLOSURE (Right)  SURGEON:  Surgeon(s) and Role:    * Cristine Polio, MD - Primary  PHYSICIAN ASSISTANT:   ASSISTANTS: none   ANESTHESIA:   general  EBL:  Total I/O In: 1300 [I.V.:1300] Out: -   BLOOD ADMINISTERED:none  DRAINS: none   LOCAL MEDICATIONS USED:  LIDOCAINE   SPECIMEN:  Excision  DISPOSITION OF SPECIMEN:  PATHOLOGY  COUNTS:  YES  TOURNIQUET:  * No tourniquets in log *  DICTATION: .Other Dictation: Dictation Number [id number]  PLAN OF CARE: Discharge to home after PACU  PATIENT DISPOSITION:  PACU - hemodynamically stable.   Delay start of Pharmacological VTE agent (>24hrs) due to surgical blood loss or risk of bleeding: yes

## 2014-04-18 NOTE — Discharge Instructions (Signed)
Activity (include date of return to work if known) °As tolerated: NO showers °NO driving °No heavy activities ° °Diet:regular No restrictions: ° °Wound Care: Keep dressing clean & dry ° °Do not change dressings °For Abdominoplasties wear abdominal binder °Special Instructions: °Do not raise arms up °Continue to empty, recharge, & record drainage from J-P drains &/or °Hemovacs 2-3 times a day, as needed. °Call Doctor if any unusual problems occur such as pain, excessive °Bleeding, unrelieved Nausea/vomiting, Fever &/or chills °When lying down, keep head elevated on 2-3 pillows or back-rest °For Addominoplasties the Jack-knife position °Follow-up appointment: Please call the office. ° °The patient received discharge instruction from:___________________________________________ ° ° °Patient signature ________________________________________ / Date___________ ° ° ° °Signature of individual providing instructions/ Date________________             ° ° ° °Post Anesthesia Home Care Instructions ° °Activity: °Get plenty of rest for the remainder of the day. A responsible adult should stay with you for 24 hours following the procedure.  °For the next 24 hours, DO NOT: °-Drive a car °-Operate machinery °-Drink alcoholic beverages °-Take any medication unless instructed by your physician °-Make any legal decisions or sign important papers. ° °Meals: °Start with liquid foods such as gelatin or soup. Progress to regular foods as tolerated. Avoid greasy, spicy, heavy foods. If nausea and/or vomiting occur, drink only clear liquids until the nausea and/or vomiting subsides. Call your physician if vomiting continues. ° °Special Instructions/Symptoms: °Your throat may feel dry or sore from the anesthesia or the breathing tube placed in your throat during surgery. If this causes discomfort, gargle with warm salt water. The discomfort should disappear within 24 hours. ° °

## 2014-04-19 ENCOUNTER — Encounter (HOSPITAL_BASED_OUTPATIENT_CLINIC_OR_DEPARTMENT_OTHER): Payer: Self-pay | Admitting: Specialist

## 2014-04-19 NOTE — Op Note (Signed)
Sharon Everett, Sharon Everett              ACCOUNT NO.:  [id number]  MEDICAL RECORD NO.:  [id number]  LOCATION:                                 FACILITY:  PHYSICIAN:  Berneta Sages L. Zeus Marquis, M.D.DATE OF BIRTH:  [birth year]  DATE OF PROCEDURE:  04/18/2014 DATE OF DISCHARGE:  04/18/2014                              OPERATIVE REPORT   INDICATIONS FOR PROCEDURE:  A 48 year old lady with severe hidradenitis involving the right axillary region, very severe in nature, has had previous I and D's multiple times with antibiotic therapy with no avail in treatment.  PROCEDURES PLANNED:  Excision of large huge severe hidradenitis right axilla, closure of defect with a Ryan-Pollack advancement of flap coverage.  ANESTHESIA:  General.  Preoperatively, the patient underwent drawings for elliptical excision of all the hair bearing area and the intense swollen, irritated, weepy areas from previous hidradenitis.  She underwent general anesthesia and intubated orally.  Prep was done to the right arm, shoulder, breast, neck areas and axilla using Hibiclens soap and solution walled off with sterile towels and drapes so as to make a sterile field.  A 1.5% Xylocaine with epinephrine was injected locally, a total of 100 mL. This was allowed to set up and then excision of a large __________ large elliptical skin was done with the Bovie on cutting down to underlying superficial fascia.  Hemostasis was maintained with the Bovie anticoagulation.  Next, the Kocher clamps were placed and the dissection was carried out to remove the large __________ inflamed tissue. Hemostasis again maintained with the Bovie anticoagulation.  Next, the flap was closed in advancement by freeing up the lateral and medial margins out approximately 6 cm to allow advancement closure using a Ryan- Pollack closure and deep sutures of 2-0 Monocryl x2 layers.  A subdermal suture of 3-0 Monocryl and then a running subcuticular stitch of  3-0 Monocryl.  Half-inch Steri-Strips and soft dressing were applied to all areas, 4x4s, ABDs and Hypafix tape.  She tolerated all the procedures very well and was taken to recovery in excellent condition.  ESTIMATED BLOOD LOSS:  Less than 150 mL.  COMPLICATIONS:  None.     Odella Aquas. Towanda Malkin, M.D.     Elie Confer  D:  04/18/2014  T:  04/18/2014  Job:  [id number]

## 2014-06-04 IMAGING — US ABDOMEN ULTRASOUND LIMITED
1 series · 14 of 25 positions shown · non-contrast
Comparison: none

REASON FOR EXAM: HTN Snoring Morbid Obesity
COMMENTS:

[Series 1: abdomen ultrasound limited · 0.26mm/px · 14 of 70 slices shown]
[im 1/70]
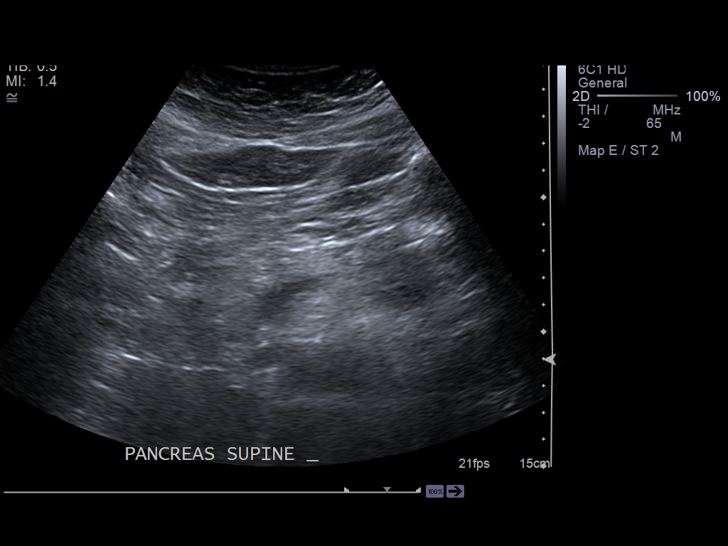
[im 6/70]
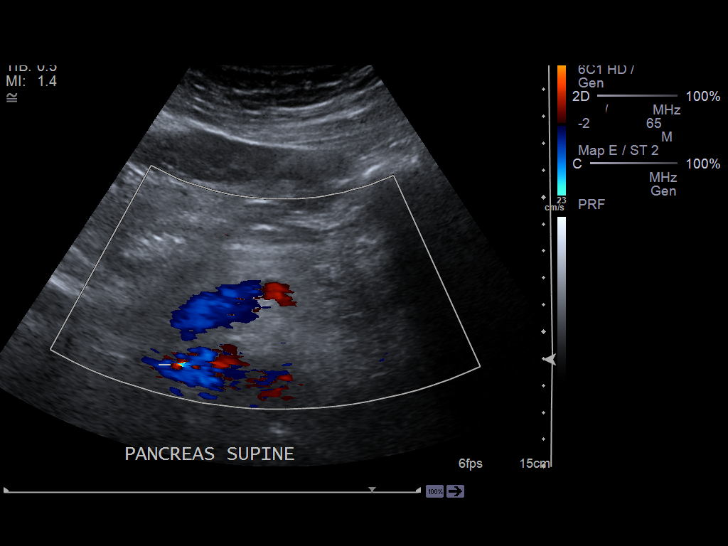
[im 12/70]
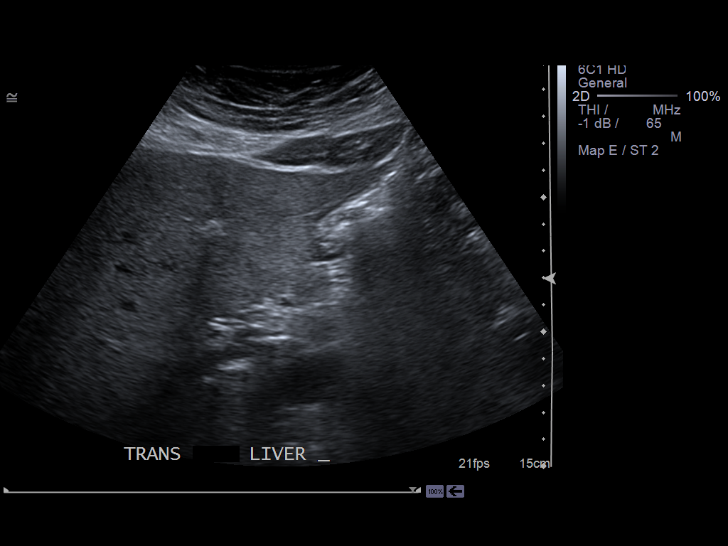
[im 18/70]
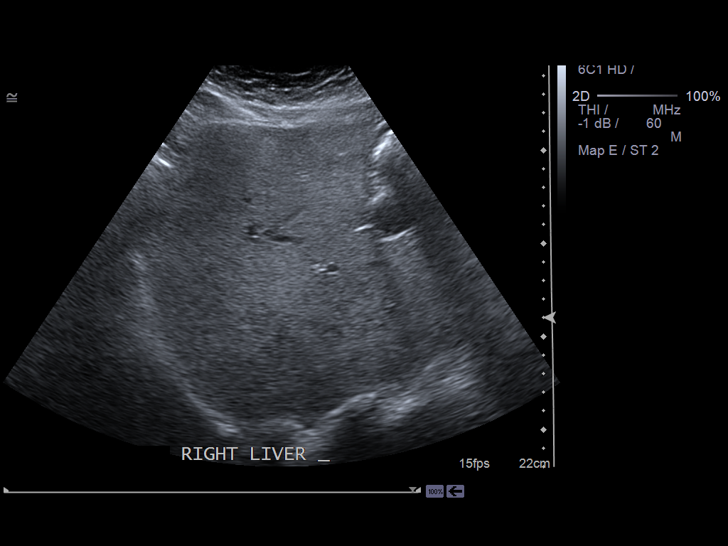
[im 24/70]
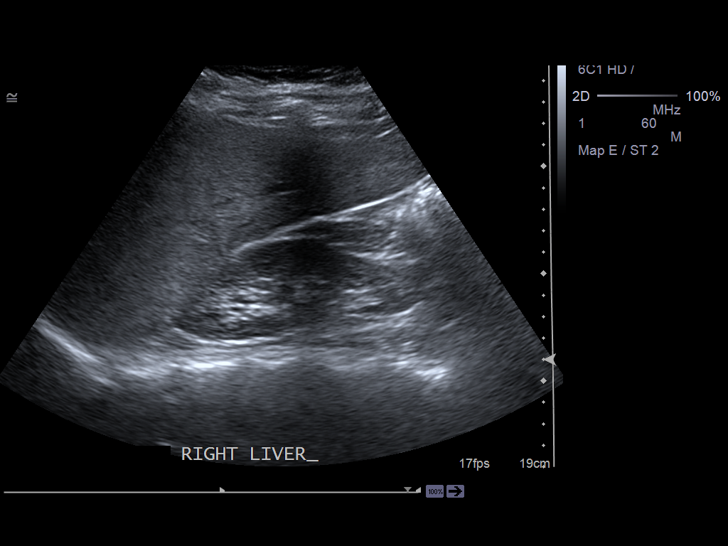
[im 26/70]
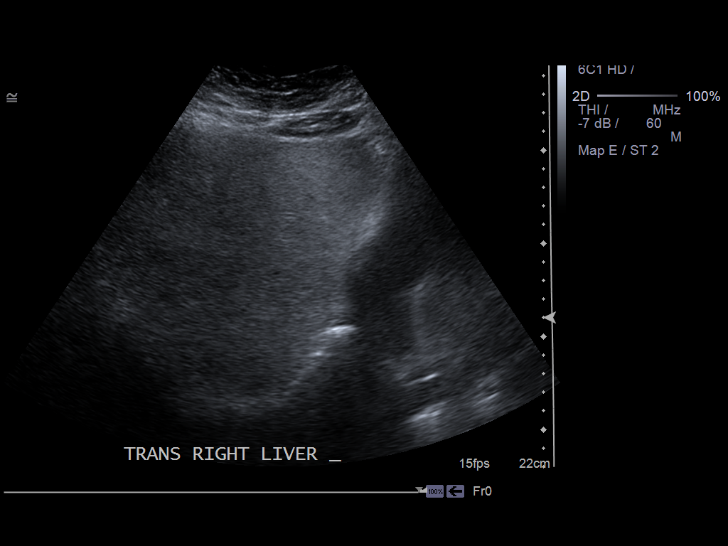
[im 32/70]
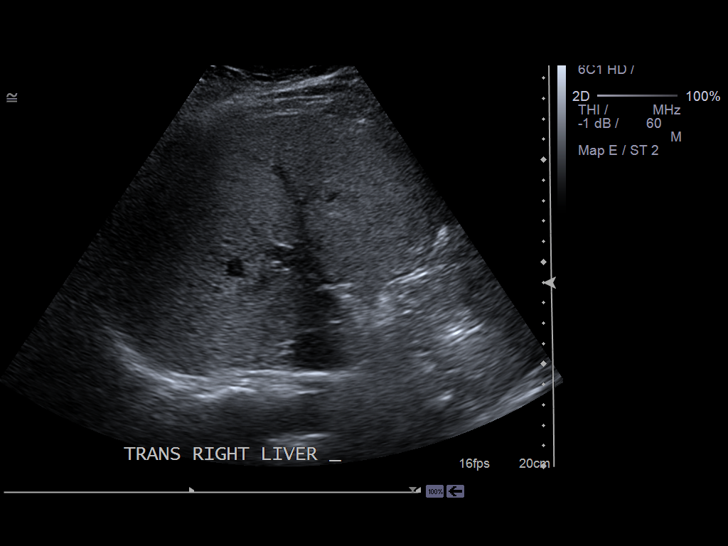
[im 38/70]
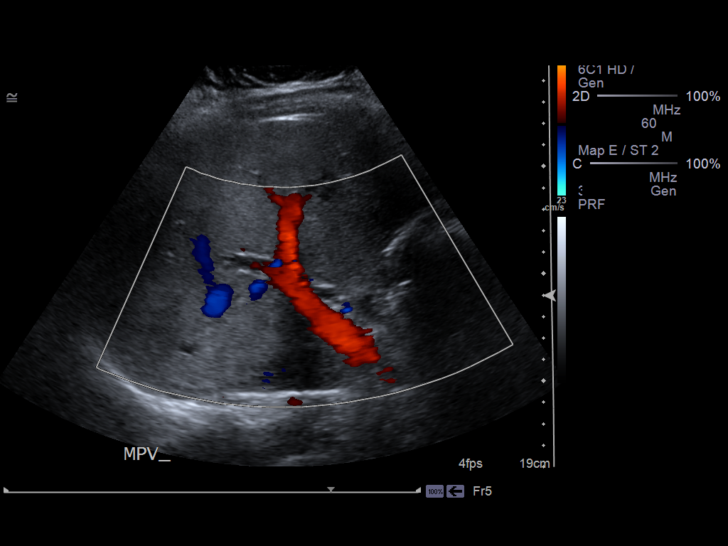
[im 44/70]
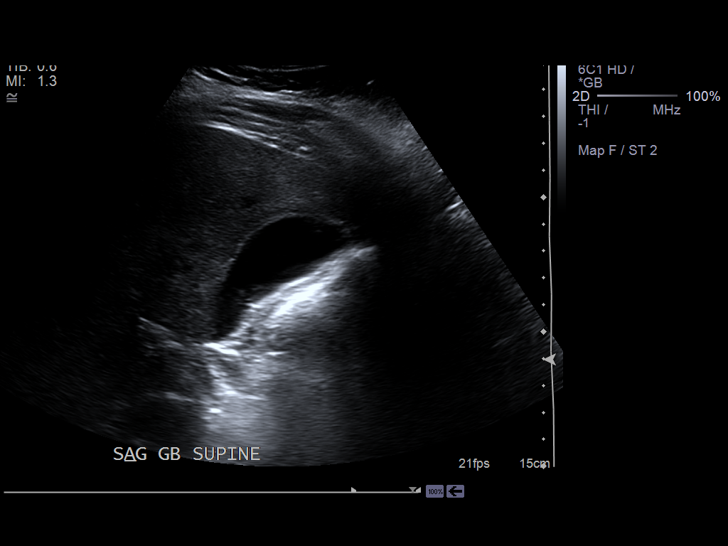
[im 47/70]
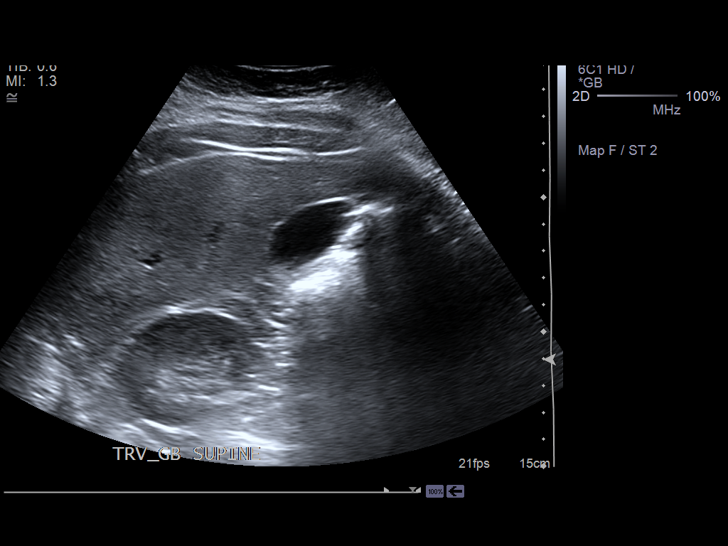
[im 52/70]
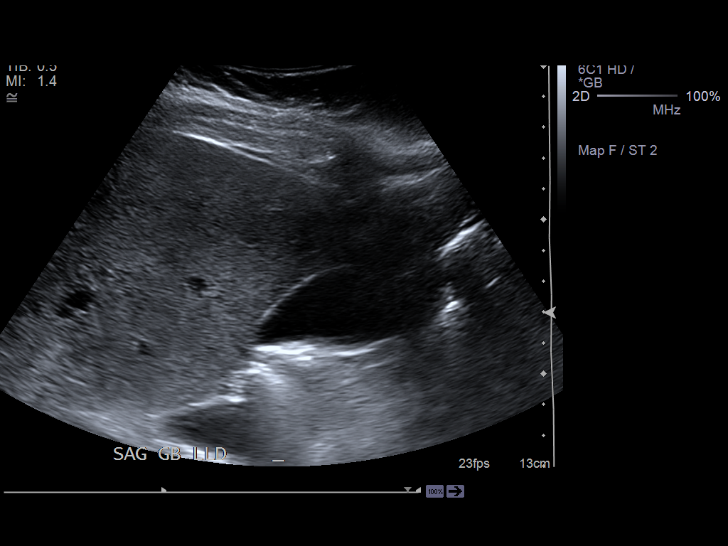
[im 58/70]
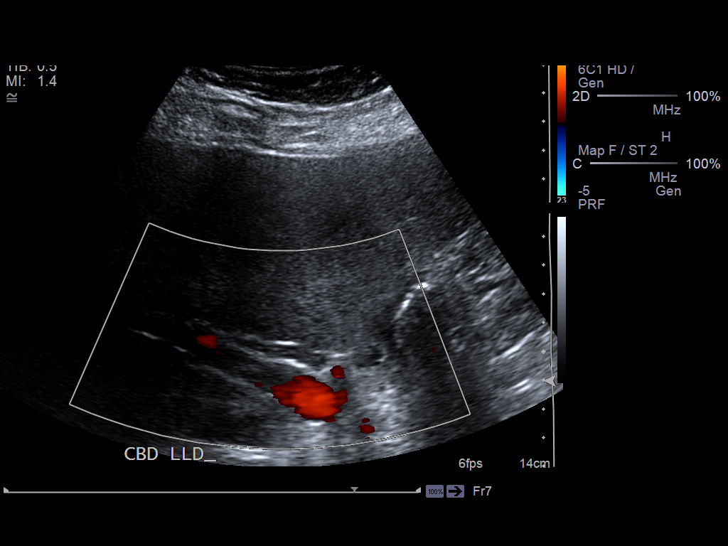
[im 64/70]
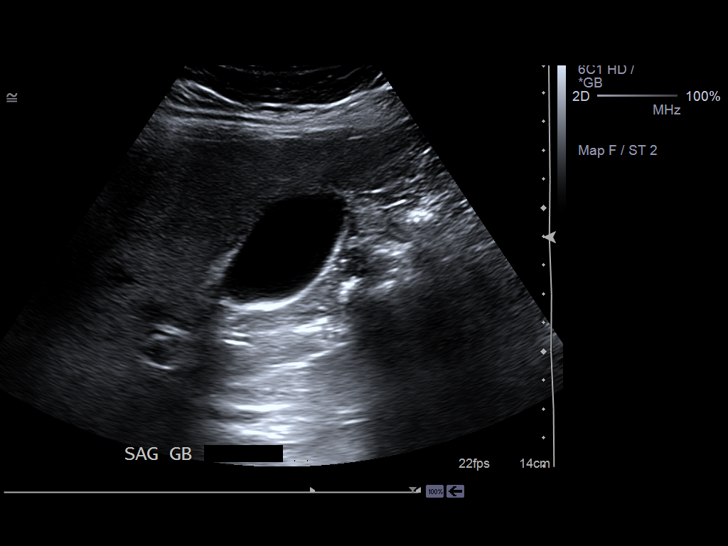
[im 70/70]
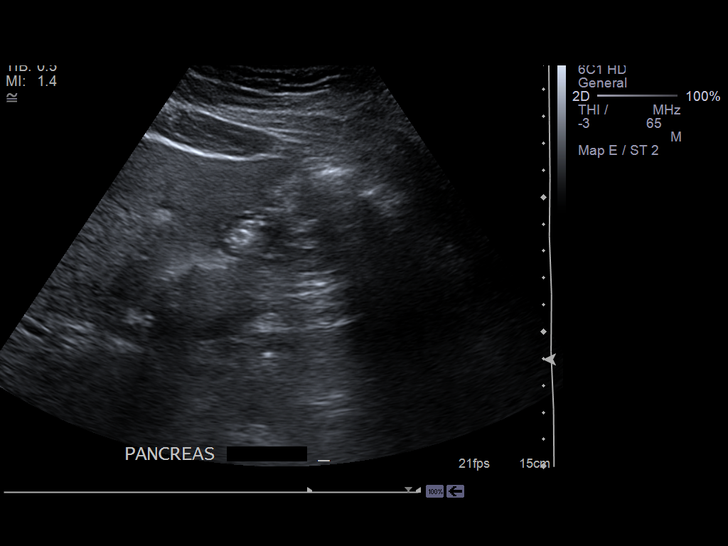

[14 of 25 positions shown; findings below may reference images not displayed]

PROCEDURE:     US  - US ABDOMEN LIMITED SURVEY  - October 16, 2012  [DATE]

RESULT:     A limited upper quadrant ultrasound examination was performed.

The liver demonstrates very mildly increased echotexture suggesting fatty
infiltration. Portal venous flow is normal in direction toward the liver.
The gallbladder is adequately distended with no evidence of stones, wall
thickening, or pericholecystic fluid. There is no positive sonographic
Murphy's sign. The common bile duct measures 5.4 mm in diameter. Evaluation
the pancreas is limited by bowel gas.
IMPRESSION: 1. The gallbladder exhibits no evidence of stones or other acute
abnormality. The common bile duct is normal in caliber.
2. The liver exhibits mildly increased echotexture suggesting fatty
infiltrative change.
3. Evaluation the pancreas is limited by bowel gas.

[REDACTED]

## 2014-07-28 IMAGING — CR DG CHEST 1V PORT
1 series · 1 of 1 positions shown · non-contrast
Comparison: none

REASON FOR EXAM: Chest Pain
COMMENTS:

[ap]
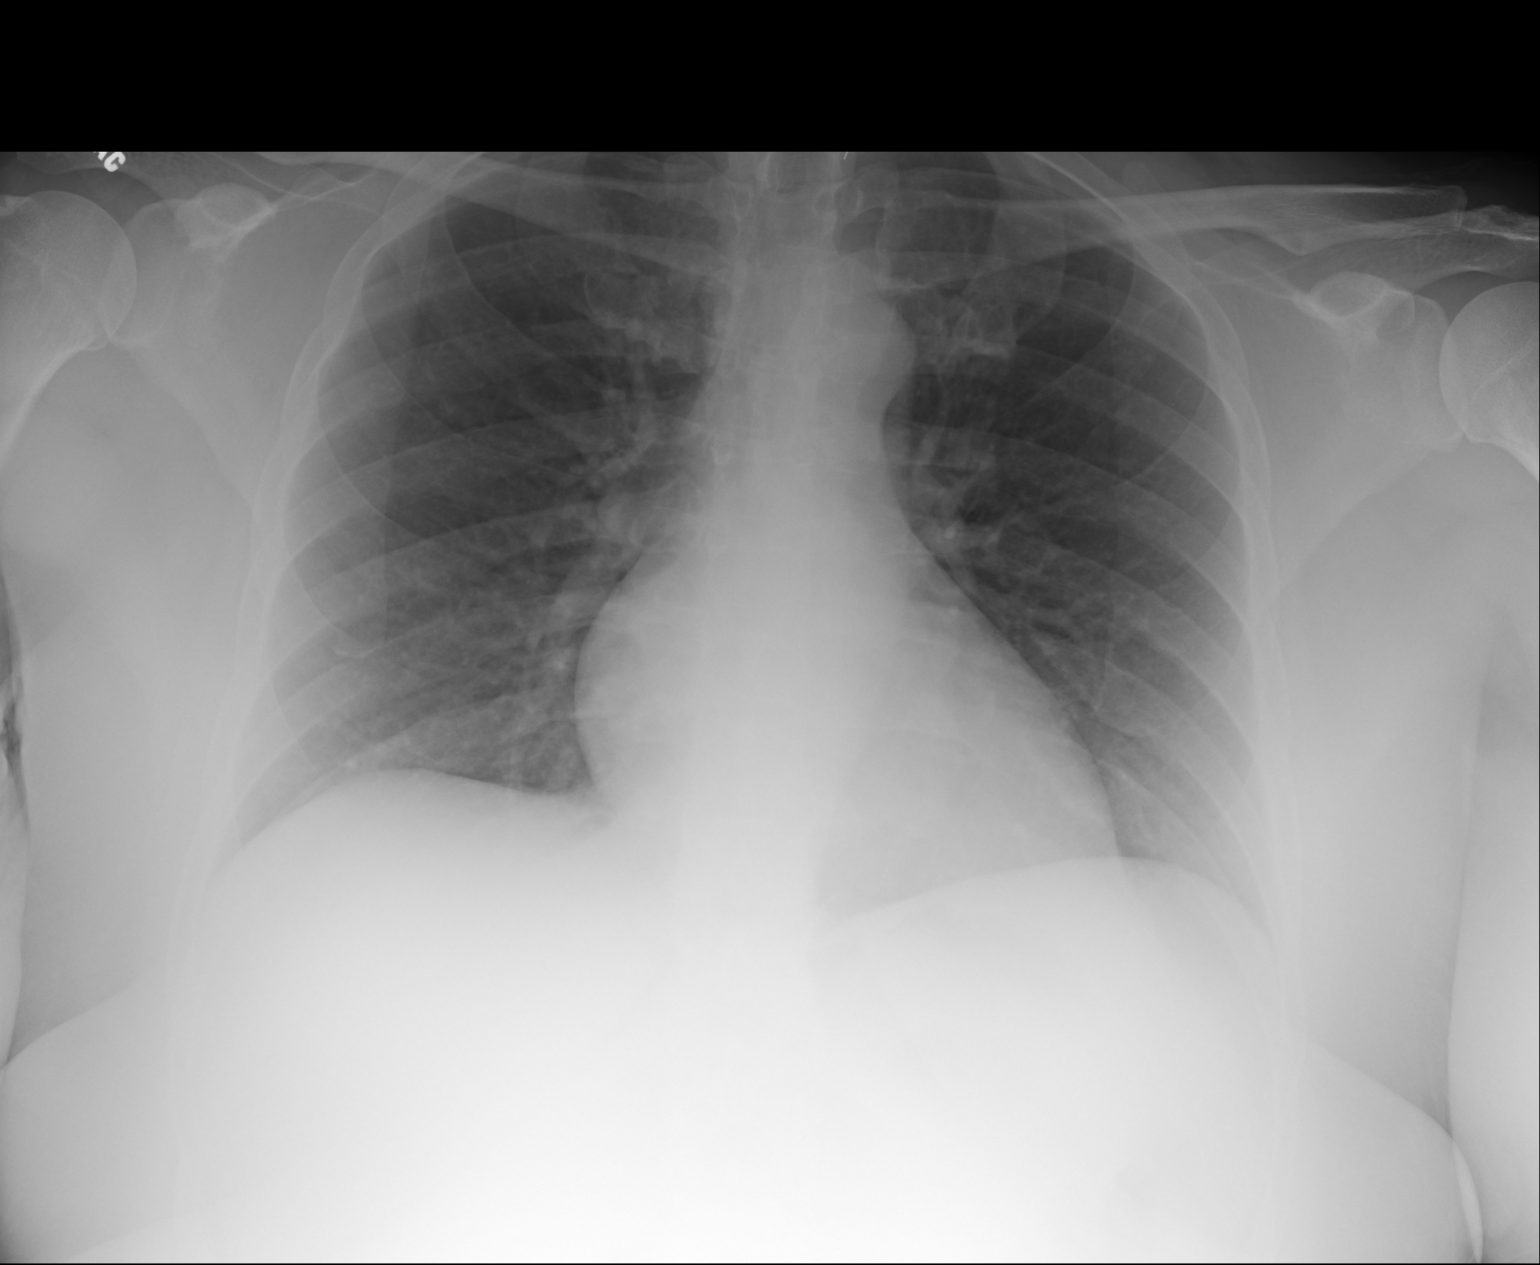

[1 of 1 positions shown; findings below may reference images not displayed]

PROCEDURE:     DXR - DXR PORTABLE CHEST SINGLE VIEW  - December 09, 2012  [DATE]

RESULT:     Comparison is made to the study dated 10/16/2012.

The lungs are clear. The heart and pulmonary vessels are normal. The bony
and mediastinal structures are unremarkable. There is no effusion. There is
no pneumothorax or evidence of congestive failure.
IMPRESSION: No acute cardiopulmonary disease.

[REDACTED]

## 2014-09-02 NOTE — Op Note (Signed)
PATIENT NAME:  Sharon Everett, MOHS MR#:  [id number] DATE OF BIRTH:  Mar 22, 1966  DATE OF PROCEDURE:  03/02/2013  PROCEDURE PERFORMED: Laparoscopic sleeve gastrectomy.   PHYSICIANS IN ATTENDANCE: Legrand Como A. Duke Salvia, MD  ASSISTANT:  Darrin Luis, PA   PREOPERATIVE DIAGNOSIS: Long-standing morbid obesity with multiple attempts at dieting to control weight.   POSTOPERATIVE DIAGNOSIS:  Long-standing morbid obesity with multiple attempts at dieting to control weight.  PROCEDURE: The patient was brought to the Operating Room and placed in the supine position. General anesthesia was obtained with orotracheal intubation. The abdomen was sterilely prepped and draped. A 5 mm Optiview trocar was introduced in the left upper quadrant of the abdomen under direct visualization, pneumoperitoneum obtained with carbon dioxide. Three additional trocars introduced across the upper abdomen and a Nathanson liver retractor introduced through a subxiphoid wound. The left lobe of the liver was elevated. The peritoneum along the upper fundus causing adherence to the undersurface of the left hemidiaphragm was divided by use of the Harmonic scalpel. There was no overt evidence of a hiatal hernia. It was decided to proceed with mobilization of the stomach inferiorly. An area was chosen approximately 4 cm proximal to the pylorus and the arcade vessels along the greater curvature of the stomach were divided over a distance of approximately 6 cm with the use of the Harmonic scalpel, peritoneal attachments to the underlying pancreas also freed within the region of the lesser sac. A 34-French ViSiGi device was then directed transorally and directed into the level of the antrum. This was placed to suction for full decompression of the stomach. Next, a green load GIA stapler was used to initiate creation of a medially-based gastric tube effect. The first staple firing was directed in a relative transverse direction in an effort to avoid  narrowing at the level of the incisura. A second green load staple was placed at slight increased angulation paralleling the lesser curvature of the stomach. After firing this, the patient then underwent a series of gold load staples placed parallel to the lesser curvature and ultimately brought out just lateral to the angle of His. As this was progressed proximally, confirmation was made to assure absence of any peritoneal attachments to the pancreas. Any identified were divided by use of the Harmonic scalpel. The staple line was reinforced with seam guard staple buttress system with the exception of green load staples in the last firing in the upper stomach. After completing this, the intact residual greater curvature vessels were divided by use of the Harmonic scalpel with full mobilization of the gastrosplenic and gastrocolic ligament. The lateral stomach delivered as a specimen. The ViSiGi device was partially withdrawn and then the stomach distended with air through the [place] device. There was no evidence of an air leak noted along the staple line with a saline bath performed. The ViSiGi device was withdrawn, the lateral stomach retrieved by way of the right upper quadrant 12 mm trocar site which was digitally dilated. The fascia and peritoneum of this defect was then closed with 0 Vicryl suture passed by way of a needle suture passing system. The lateral aspects of the sleeve were secured to the divided aspects of the gastrocolic and gastrosplenic ligament with interrupted 2-0 Surgidac suture prior to retrieval of the stomach and closure of the 12 mm trocar fascial defect. At this point, the pneumoperitoneum relieved, the trocar removed along with the liver retractor the wounds injected with 0.25% Marcaine with epinephrine followed by 4-0 Monocryl in the dermis followed  by Dermabond. The patient at this time was allowed direct recover from anesthesia, having tolerated the procedure well. There was minimal  blood loss.     ____________________________ Venia Carbon Duke Salvia, MD mat:cs D: 03/02/2013 14:41:40 ET T: 03/02/2013 15:05:58 ET JOB#: [id number]  cc: Legrand Como A. Duke Salvia, MD, <Dictator> Ladora Daniel MD ELECTRONICALLY SIGNED 04/13/2013 12:16

## 2023-10-23 ENCOUNTER — Ambulatory Visit: Payer: Self-pay
